# Patient Record
Sex: Female | Born: 1960 | Hispanic: Yes | Marital: Married | Smoking: Never smoker
Health system: Southern US, Community
[De-identification: ages and names within clinical notes are randomized; demographics above are authoritative.]

## PROBLEM LIST (undated history)

## (undated) DIAGNOSIS — I10 Essential (primary) hypertension: Secondary | ICD-10-CM

## (undated) DIAGNOSIS — E119 Type 2 diabetes mellitus without complications: Secondary | ICD-10-CM

---

## 2004-12-10 ENCOUNTER — Ambulatory Visit: Payer: Self-pay | Admitting: General Practice

## 2006-01-12 ENCOUNTER — Ambulatory Visit: Payer: Self-pay | Admitting: General Practice

## 2006-07-22 DIAGNOSIS — I10 Essential (primary) hypertension: Secondary | ICD-10-CM | POA: Insufficient documentation

## 2006-07-22 DIAGNOSIS — F064 Anxiety disorder due to known physiological condition: Secondary | ICD-10-CM | POA: Insufficient documentation

## 2006-07-22 DIAGNOSIS — E782 Mixed hyperlipidemia: Secondary | ICD-10-CM | POA: Insufficient documentation

## 2006-07-22 DIAGNOSIS — R002 Palpitations: Secondary | ICD-10-CM | POA: Insufficient documentation

## 2007-05-19 ENCOUNTER — Ambulatory Visit: Payer: Self-pay | Admitting: Internal Medicine

## 2008-09-25 ENCOUNTER — Ambulatory Visit: Payer: Self-pay | Admitting: Internal Medicine

## 2008-10-01 ENCOUNTER — Ambulatory Visit: Payer: Self-pay | Admitting: Family Medicine

## 2008-11-30 ENCOUNTER — Ambulatory Visit (HOSPITAL_COMMUNITY): Admission: RE | Admit: 2008-11-30 | Discharge: 2008-11-30 | Payer: Self-pay | Admitting: Neurosurgery

## 2008-12-26 ENCOUNTER — Encounter: Payer: Self-pay | Admitting: Neurosurgery

## 2009-01-17 ENCOUNTER — Encounter: Payer: Self-pay | Admitting: Neurosurgery

## 2009-05-28 ENCOUNTER — Ambulatory Visit: Payer: Self-pay | Admitting: Internal Medicine

## 2010-10-28 LAB — CBC
HCT: 41.8 % (ref 36.0–46.0)
Hemoglobin: 14.4 g/dL (ref 12.0–15.0)
MCHC: 34.5 g/dL (ref 30.0–36.0)
RDW: 13.9 % (ref 11.5–15.5)

## 2010-10-28 LAB — BASIC METABOLIC PANEL
CO2: 28 mEq/L (ref 19–32)
Calcium: 9.8 mg/dL (ref 8.4–10.5)
Glucose, Bld: 94 mg/dL (ref 70–99)
Potassium: 4 mEq/L (ref 3.5–5.1)
Sodium: 142 mEq/L (ref 135–145)

## 2010-12-02 NOTE — Op Note (Signed)
Diana Bonilla, Diana Bonilla              ACCOUNT NO.:  [id number]   MEDICAL RECORD NO.:  [id number]          PATIENT TYPE:  OIB   LOCATION:  3534                         FACILITY:  [facility]   PHYSICIAN:  Reinaldo Meeker, M.D. DATE OF BIRTH:  Sep 19, 1960   DATE OF PROCEDURE:  11/30/2008  DATE OF DISCHARGE:  11/30/2008                               OPERATIVE REPORT   PREOPERATIVE DIAGNOSIS:  Herniated disk L4-5, right.   POSTOPERATIVE DIAGNOSIS:  Herniated disk L4-5, right.   PROCEDURE:  Right L4-5 interlaminar laminotomy for excision of herniated  disk with operative microscope.   SECONDARY PROCEDURE:  Microdissection L4-5 disk and L5 nerve root.   SURGEON:  Reinaldo Meeker, MD   ASSISTANT:  Donalee Citrin, MD   PROCEDURE IN DETAIL:  After being placed in the prone position, the  patient's back was prepped and draped in usual sterile fashion.  Localizing x-rays taken prior to incision to identify the appropriate  level.  Midline incision was made above the spinous processes of L4 and  L5.  Using Bovie cutting current, the incision was carried down the  spinous processes.  A subperiosteal dissection was carried out on the  right side of the spinous processes and lamina.  Self-retaining  retractor was placed for exposure.  X-rays showed approach to the  appropriate level.  Using a high-speed drill, the inferior one-third of  the L4 lamina, the medial one-third of the facet joint, and superior one-  third of the L5 lamina were removed.  Residual bone was removed with the  Kerrison punch.  Ligamentum flavum was removed in a piecemeal fashion.  Microscope was draped, brought into the field, and used for remainder of  the case.  Using microdissection technique, the lateral aspect of thecal  sac and L5 nerve root were identified.  Further coagulation was carried  out down from the canal to identify the L4-5 disk, which was found to be  remarkably herniated beneath the nerve root.  After coagulating  on the  annulus, the annulus was incised with a 15 blade.  Using pituitary  rongeurs and curettes, the disk space clean-out was carried out.  At the  same time, great care was taken to avoid injury to the neural elements,  this was successfully done.  At this point, inspection was carried out  in all directions for any evidence of residual compression and none  could be identified.  Large amounts of irrigation were carried out.  Any  bleeding controlled with bipolar coagulation and Gelfoam.  The wounds  were then closed in multiple layers of Vicryl on the muscle, fascia,  subcutaneous, subcuticular tissues and staples on the skin.  A sterile  dressing was then applied, and the patient was extubated and taken to  recovery room in stable condition.           ______________________________  Reinaldo Meeker, M.D.     ROK/MEDQ  D:  11/30/2008  T:  12/01/2008  Job:  [id number]

## 2013-09-18 ENCOUNTER — Ambulatory Visit: Payer: Self-pay | Admitting: Internal Medicine

## 2013-11-05 ENCOUNTER — Emergency Department: Payer: Self-pay | Admitting: Emergency Medicine

## 2014-09-21 ENCOUNTER — Ambulatory Visit: Payer: Self-pay | Admitting: Internal Medicine

## 2015-05-27 ENCOUNTER — Other Ambulatory Visit: Payer: Self-pay | Admitting: Internal Medicine

## 2015-05-27 DIAGNOSIS — Z1231 Encounter for screening mammogram for malignant neoplasm of breast: Secondary | ICD-10-CM

## 2015-09-24 ENCOUNTER — Ambulatory Visit
Admission: RE | Admit: 2015-09-24 | Discharge: 2015-09-24 | Disposition: A | Discharge status: Home / Self Care | Payer: BLUE CROSS/BLUE SHIELD | Source: Ambulatory Visit | Attending: Internal Medicine | Admitting: Internal Medicine

## 2015-09-24 DIAGNOSIS — Z1231 Encounter for screening mammogram for malignant neoplasm of breast: Secondary | ICD-10-CM | POA: Diagnosis not present

## 2016-09-22 ENCOUNTER — Other Ambulatory Visit: Payer: Self-pay | Admitting: Internal Medicine

## 2016-09-22 DIAGNOSIS — Z1231 Encounter for screening mammogram for malignant neoplasm of breast: Secondary | ICD-10-CM

## 2016-10-29 ENCOUNTER — Ambulatory Visit
Admission: RE | Admit: 2016-10-29 | Discharge: 2016-10-29 | Disposition: A | Discharge status: Home / Self Care | Payer: BLUE CROSS/BLUE SHIELD | Source: Ambulatory Visit | Attending: Internal Medicine | Admitting: Internal Medicine

## 2016-10-29 DIAGNOSIS — Z1231 Encounter for screening mammogram for malignant neoplasm of breast: Secondary | ICD-10-CM | POA: Diagnosis not present

## 2017-10-07 ENCOUNTER — Other Ambulatory Visit: Payer: Self-pay | Admitting: Internal Medicine

## 2017-10-07 DIAGNOSIS — Z1231 Encounter for screening mammogram for malignant neoplasm of breast: Secondary | ICD-10-CM

## 2017-10-26 ENCOUNTER — Ambulatory Visit
Admission: RE | Admit: 2017-10-26 | Discharge: 2017-10-26 | Disposition: A | Discharge status: Home / Self Care | Payer: BLUE CROSS/BLUE SHIELD | Source: Ambulatory Visit | Attending: Internal Medicine | Admitting: Internal Medicine

## 2017-10-26 DIAGNOSIS — Z1231 Encounter for screening mammogram for malignant neoplasm of breast: Secondary | ICD-10-CM | POA: Diagnosis present

## 2018-09-15 ENCOUNTER — Other Ambulatory Visit: Payer: Self-pay | Admitting: Family

## 2018-09-15 DIAGNOSIS — Z1231 Encounter for screening mammogram for malignant neoplasm of breast: Secondary | ICD-10-CM

## 2019-01-11 ENCOUNTER — Ambulatory Visit
Admission: RE | Admit: 2019-01-11 | Discharge: 2019-01-11 | Disposition: A | Discharge status: Home / Self Care | Payer: BC Managed Care – PPO | Source: Ambulatory Visit | Attending: Family | Admitting: Family

## 2019-01-11 ENCOUNTER — Other Ambulatory Visit: Payer: Self-pay

## 2019-01-11 DIAGNOSIS — Z1231 Encounter for screening mammogram for malignant neoplasm of breast: Secondary | ICD-10-CM | POA: Diagnosis not present

## 2019-03-28 ENCOUNTER — Other Ambulatory Visit: Payer: Self-pay

## 2019-03-28 DIAGNOSIS — Z20822 Contact with and (suspected) exposure to covid-19: Secondary | ICD-10-CM

## 2019-03-29 LAB — NOVEL CORONAVIRUS, NAA: SARS-CoV-2, NAA: NOT DETECTED

## 2019-11-04 ENCOUNTER — Other Ambulatory Visit: Payer: Self-pay

## 2019-11-04 ENCOUNTER — Encounter: Payer: Self-pay | Admitting: Emergency Medicine

## 2019-11-04 ENCOUNTER — Ambulatory Visit
Admission: EM | Admit: 2019-11-04 | Discharge: 2019-11-04 | Disposition: A | Discharge status: Home / Self Care | Payer: BC Managed Care – PPO | Attending: Family Medicine | Admitting: Family Medicine

## 2019-11-04 DIAGNOSIS — R200 Anesthesia of skin: Secondary | ICD-10-CM | POA: Diagnosis not present

## 2019-11-04 HISTORY — DX: Type 2 diabetes mellitus without complications: E11.9

## 2019-11-04 HISTORY — DX: Essential (primary) hypertension: I10

## 2019-11-04 MED ORDER — MELOXICAM 15 MG PO TABS: 15.0000 mg | ORAL_TABLET | Freq: Every day | ORAL | 0 refills | Status: AC | PRN

## 2019-11-04 NOTE — Discharge Instructions (Signed)
Medication daily as needed.  Splint at night.  If persists, see [facility] [phone]) OR [facility] [phone]).  Take care  Dr. Adriana Simas

## 2019-11-04 NOTE — ED Provider Notes (Signed)
[facility]    CSN: [id number] Arrival date & time: 11/04/19  1430      History   Chief Complaint Chief Complaint  Patient presents with  . Numbness    right  . Blister    right thumb   HPI  59 year old female presents with the above complaints.  Patient reports ongoing numbness of her right hand.  Patient states that this all started on the Saturday after she received her Covid vaccine on 4/7.  Patient reports right hand numbness.  Patient also reports some raised areas on her right thumb.  She is unsure of the etiology of these.  Patient reports that her pain occurs primarily at night.  4/10 in severity.  She is bothered by the hand numbness.  She is concerned that she has poor circulation.  No relieving factors.  No other associated symptoms.  No other complaints.  Past Medical History:  Diagnosis Date  . Diabetes mellitus without complication ([place])   . Hypertension    Home Medications    Prior to Admission medications   Medication Sig Start Date End Date Taking? Authorizing Provider  losartan-hydrochlorothiazide (HYZAAR) 100-12.5 MG tablet Take by mouth. 10/04/13  Yes [provider]  metFORMIN (GLUCOPHAGE-XR) 500 MG 24 hr tablet Take 500 mg by mouth daily. 10/09/19  Yes [provider]  simvastatin (ZOCOR) 20 MG tablet Take 20 mg by mouth at bedtime. 10/09/19  Yes [provider]  verapamil (CALAN-SR) 240 MG CR tablet Take 240 mg by mouth daily. 10/09/19  Yes [provider]  meloxicam (MOBIC) 15 MG tablet Take 1 tablet (15 mg total) by mouth daily as needed. 11/04/19   Coral Spikes, DO    Family History Family History  Problem Relation Age of Onset  . Breast cancer Neg Hx     Social History Social History   Tobacco Use  . Smoking status: Never Smoker  . Smokeless tobacco: Never Used  Substance Use Topics  . Alcohol use: Not Currently  . Drug use: Never     Allergies   Patient has no known allergies.    Review of Systems Review of Systems Per HPI  Physical Exam Triage Vital Signs ED Triage Vitals  Enc Vitals Group     BP 11/04/19 1445 (!) 149/72     Pulse Rate 11/04/19 1445 66     Resp 11/04/19 1445 14     Temp 11/04/19 1445 98.1 F (36.7 C)     Temp Source 11/04/19 1445 Oral     SpO2 11/04/19 1445 100 %     Weight 11/04/19 1441 175 lb (79.4 kg)     Height 11/04/19 1441 5\' 4"  (1.626 m)     Head Circumference --      Peak Flow --      Pain Score 11/04/19 1441 4     Pain Loc --      Pain Edu? --      Excl. in [place]? --    Updated Vital Signs BP (!) 149/72 (BP Location: Left Arm)   Pulse 66   Temp 98.1 F (36.7 C) (Oral)   Resp 14   Ht 5\' 4"  (1.626 m)   Wt 79.4 kg   SpO2 100%   BMI 30.04 kg/m   Visual Acuity Right Eye Distance:   Left Eye Distance:   Bilateral Distance:    Right Eye Near:   Left Eye Near:    Bilateral Near:     Physical Exam  Vitals and nursing note reviewed.  Constitutional:      General: She is not in acute distress.    Appearance: Normal appearance. She is not ill-appearing.  HENT:     Head: Normocephalic and atraumatic.  Eyes:     General:        Right eye: No discharge.        Left eye: No discharge.     Conjunctiva/sclera: Conjunctivae normal.  Cardiovascular:     Rate and Rhythm: Normal rate and regular rhythm.     Heart sounds: No murmur.  Pulmonary:     Effort: Pulmonary effort is normal. No respiratory distress.  Skin:    Comments: Raised skin lesions noted on the right thumb.  Neurological:     Mental Status: She is alert.  Psychiatric:        Mood and Affect: Mood normal.        Behavior: Behavior normal.    UC Treatments / Results  Labs (all labs ordered are listed, but only abnormal results are displayed) Labs Reviewed - No data to display  EKG   Radiology No results found.  Procedures Procedures (including critical care time)  Medications Ordered in UC Medications - No data to display  Initial  Impression / Assessment and Plan / UC Course  I have reviewed the triage vital signs and the nursing notes.  Pertinent labs & imaging results that were available during my care of the patient were reviewed by me and considered in my medical decision making (see chart for details).    59 year old female presents with numbness of the right hand. Patient also has some skin lesions which are of uncertain etiology. I suspect that the numbness in the right hand is secondary to carpal tunnel. However, possibly related to vaccine. Placing on meloxicam. Placing in in a wrist plan at night. Supportive care.  Final Clinical Impressions(s) / UC Diagnoses   Final diagnoses:  Numbness of right hand     Discharge Instructions     Medication daily as needed.  Splint at night.  If persists, see [facility] [phone]) OR [facility] [phone]).  Take care  Dr. Adriana Simas     ED Prescriptions    Medication Sig Dispense Auth. Provider   meloxicam (MOBIC) 15 MG tablet Take 1 tablet (15 mg total) by mouth daily as needed. 30 tablet Tommie Sams, DO     PDMP not reviewed this encounter.   Tommie Sams, [place] 11/04/19 1556

## 2019-11-04 NOTE — ED Triage Notes (Signed)
Patient states that she got her 1st COVID Moderna vaccine on 10/25/19.  Patient states that she started to get soreness in her right arm when she got her COVID shot.  Patient states that it started to spread down her right arm and has numbness in her right hand.  Patient also reports blisters on her right thumb that started couple of days ago.

## 2020-01-08 ENCOUNTER — Other Ambulatory Visit: Payer: Self-pay | Admitting: Internal Medicine

## 2020-01-08 DIAGNOSIS — Z1231 Encounter for screening mammogram for malignant neoplasm of breast: Secondary | ICD-10-CM

## 2020-01-17 ENCOUNTER — Ambulatory Visit: Payer: BC Managed Care – PPO

## 2020-01-18 ENCOUNTER — Ambulatory Visit
Admission: RE | Admit: 2020-01-18 | Discharge: 2020-01-18 | Disposition: A | Discharge status: Home / Self Care | Payer: BC Managed Care – PPO | Source: Ambulatory Visit | Attending: Internal Medicine | Admitting: Internal Medicine

## 2020-01-18 DIAGNOSIS — Z1231 Encounter for screening mammogram for malignant neoplasm of breast: Secondary | ICD-10-CM | POA: Insufficient documentation

## 2020-11-25 ENCOUNTER — Other Ambulatory Visit: Payer: Self-pay | Admitting: Internal Medicine

## 2020-11-25 DIAGNOSIS — Z1231 Encounter for screening mammogram for malignant neoplasm of breast: Secondary | ICD-10-CM

## 2021-01-22 ENCOUNTER — Other Ambulatory Visit: Payer: Self-pay

## 2021-01-22 ENCOUNTER — Ambulatory Visit
Admission: RE | Admit: 2021-01-22 | Discharge: 2021-01-22 | Disposition: A | Discharge status: Home / Self Care | Payer: BC Managed Care – PPO | Source: Ambulatory Visit | Attending: Internal Medicine | Admitting: Internal Medicine

## 2021-01-22 DIAGNOSIS — Z1231 Encounter for screening mammogram for malignant neoplasm of breast: Secondary | ICD-10-CM | POA: Insufficient documentation

## 2021-01-28 ENCOUNTER — Other Ambulatory Visit: Payer: Self-pay | Admitting: Internal Medicine

## 2021-01-28 DIAGNOSIS — N6489 Other specified disorders of breast: Secondary | ICD-10-CM

## 2021-01-28 DIAGNOSIS — R928 Other abnormal and inconclusive findings on diagnostic imaging of breast: Secondary | ICD-10-CM

## 2021-01-31 ENCOUNTER — Ambulatory Visit
Admission: RE | Admit: 2021-01-31 | Discharge: 2021-01-31 | Disposition: A | Discharge status: Home / Self Care | Payer: BC Managed Care – PPO | Source: Ambulatory Visit | Attending: Internal Medicine | Admitting: Internal Medicine

## 2021-01-31 ENCOUNTER — Other Ambulatory Visit: Payer: Self-pay

## 2021-01-31 DIAGNOSIS — R928 Other abnormal and inconclusive findings on diagnostic imaging of breast: Secondary | ICD-10-CM

## 2021-01-31 DIAGNOSIS — N6489 Other specified disorders of breast: Secondary | ICD-10-CM | POA: Insufficient documentation

## 2021-08-02 IMAGING — MG DIGITAL SCREENING BILAT W/ TOMO W/ CAD
8 series · 9 of 24 positions shown · non-contrast
Comparison: Previous exam(s).

CLINICAL DATA: Screening.

EXAM:
DIGITAL SCREENING BILATERAL MAMMOGRAM WITH TOMO AND CAD

[L MLO synth-2D]
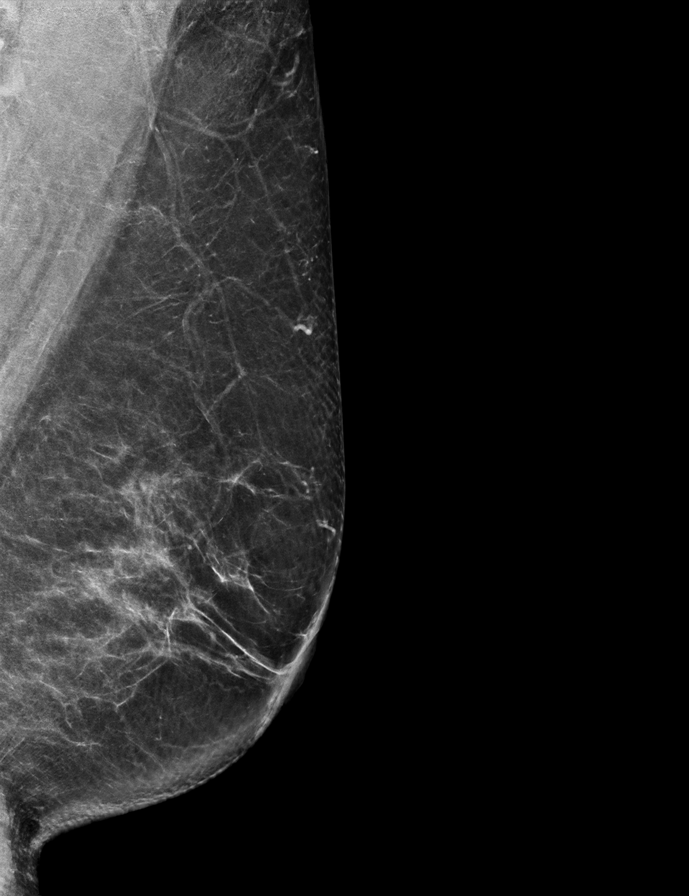

[R MLO synth-2D]
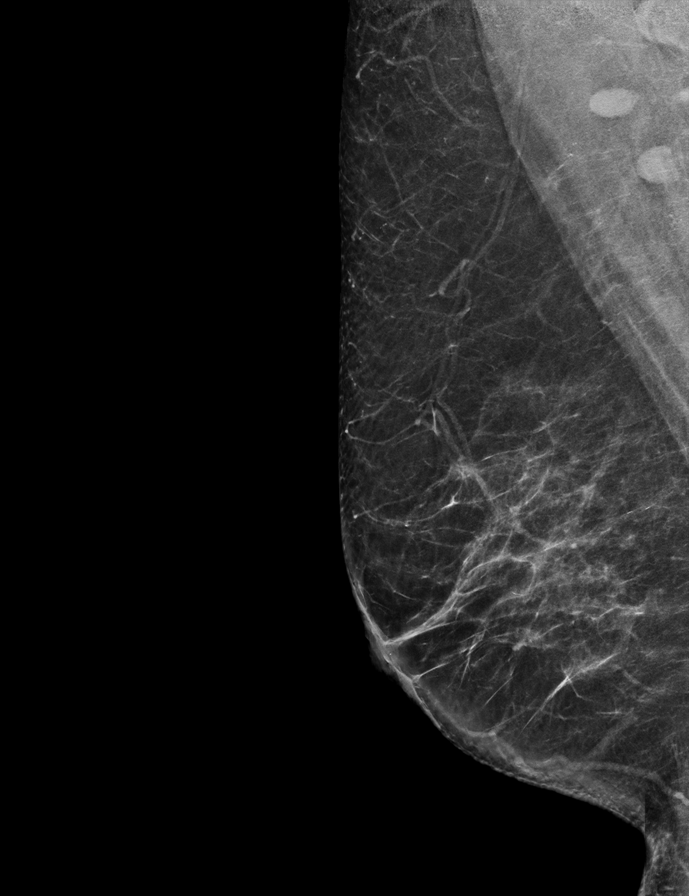

[R CC synth-2D]
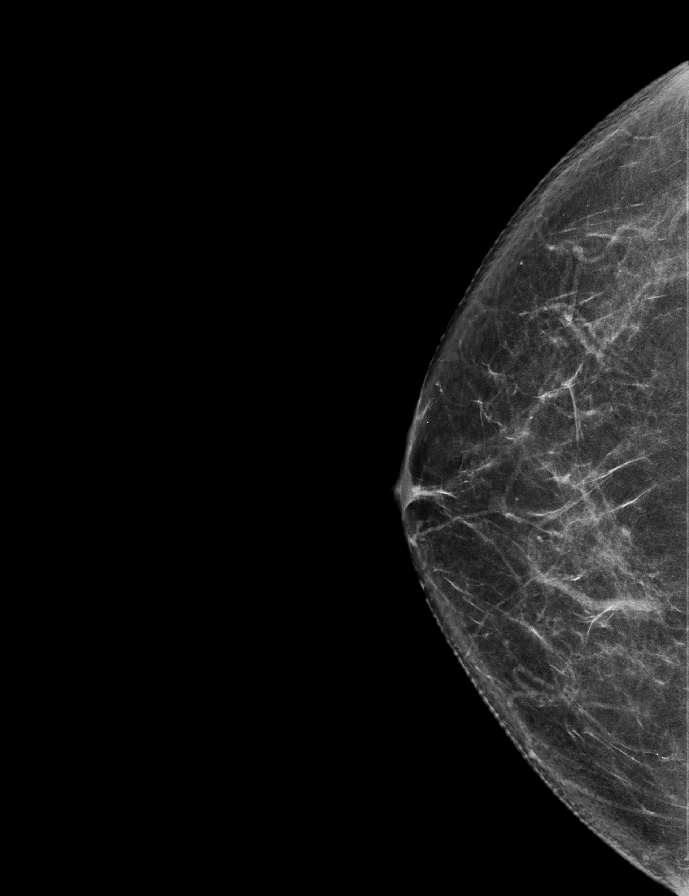

[L CC synth-2D]
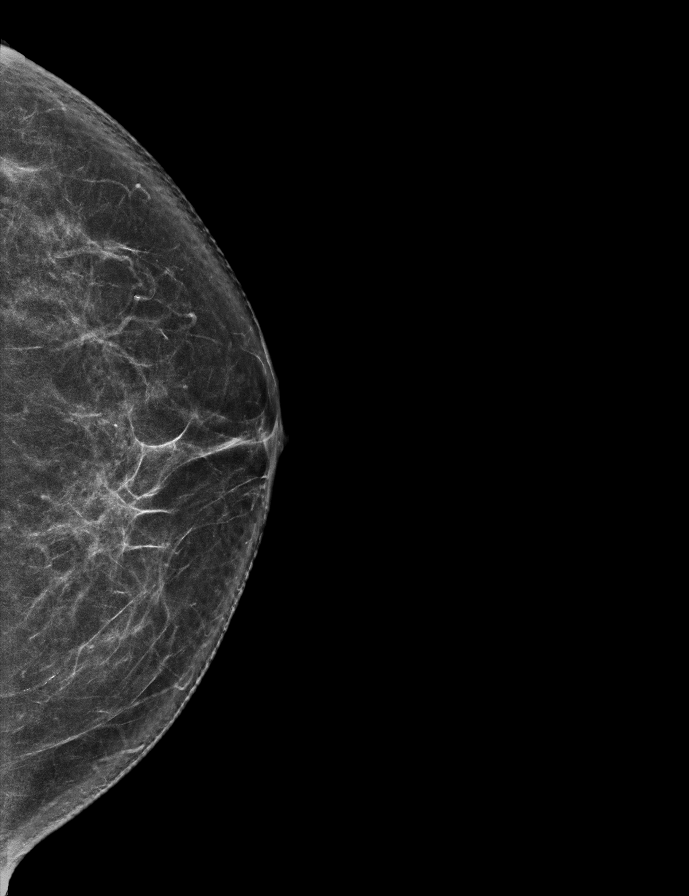

[R CC tomo · 2 of 69 frames shown]
[frame 23/69]
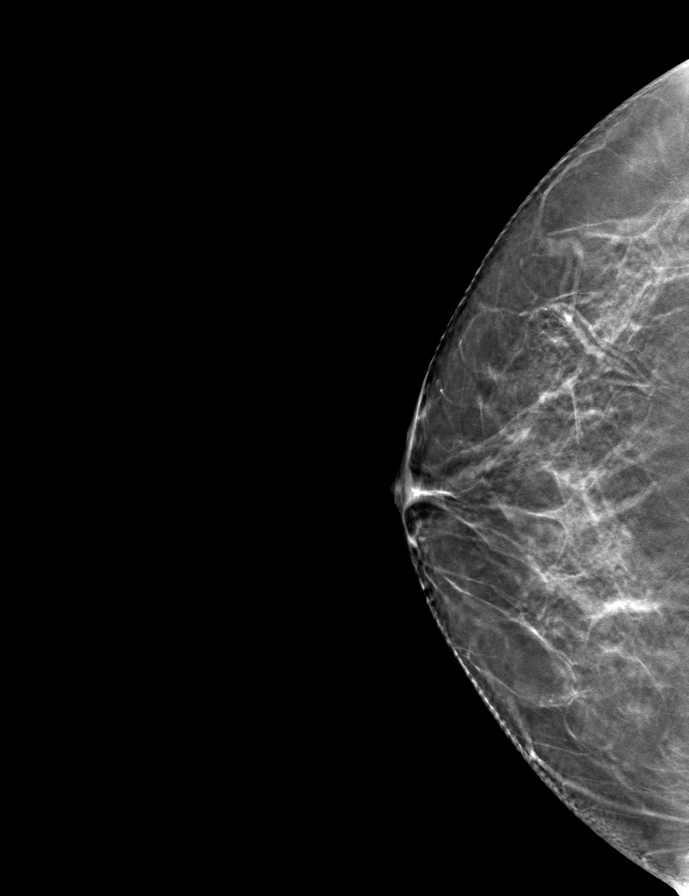
[frame 35/69]
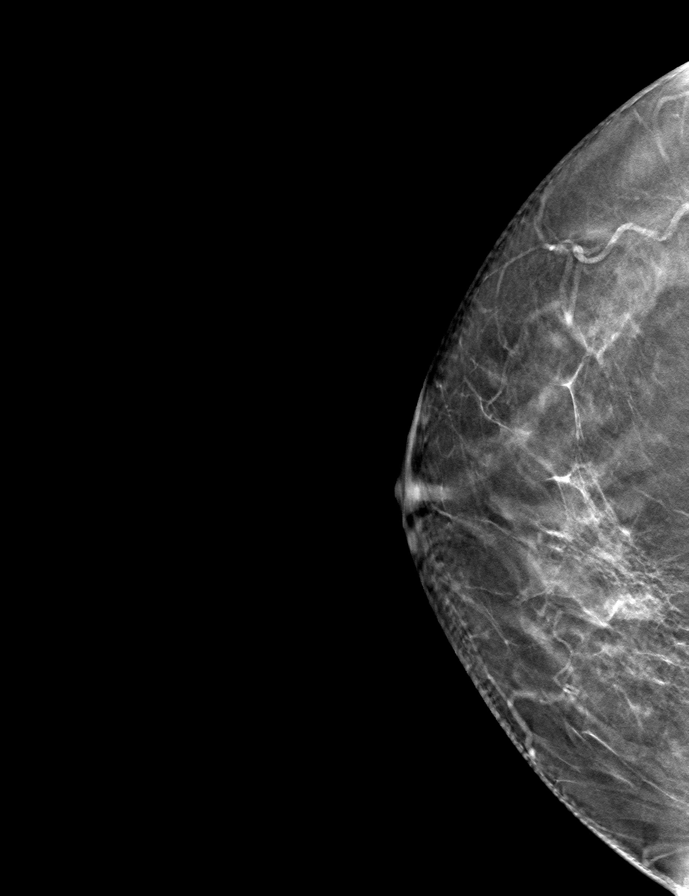

[R MLO tomo · tomo slice 37/74.0]
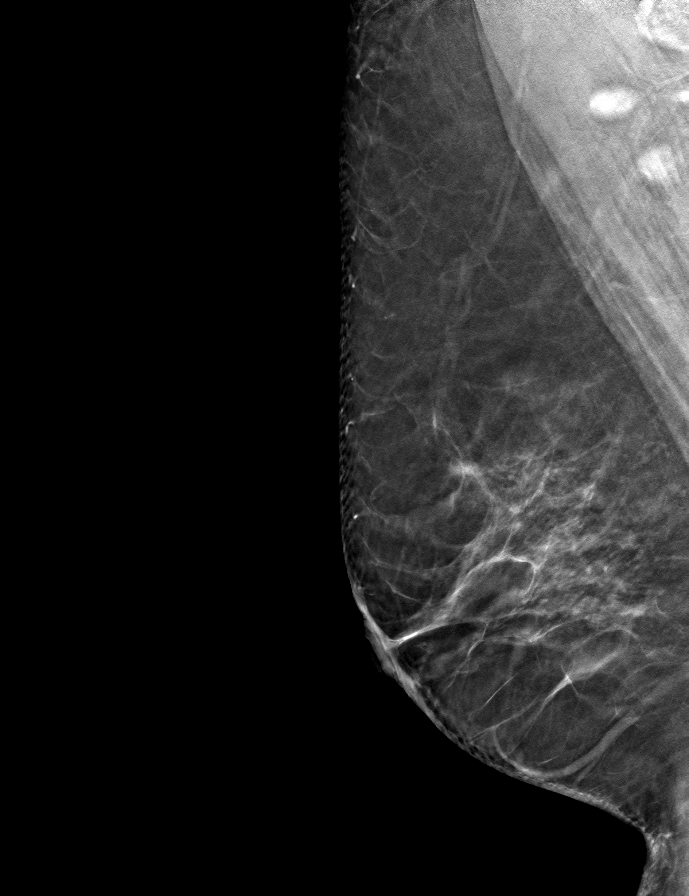

[L CC tomo · tomo slice 35/70.0]
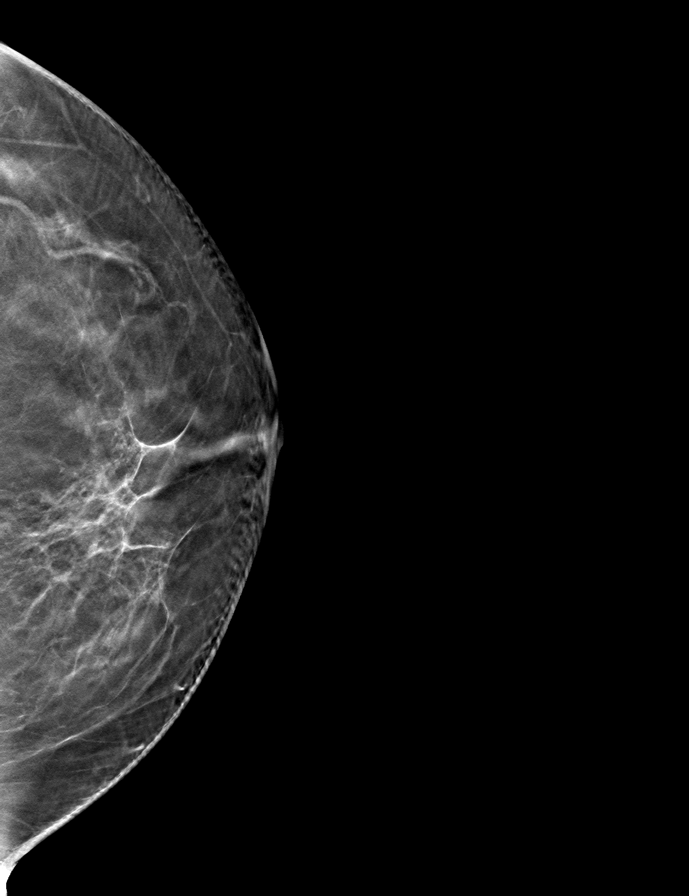

[L MLO tomo · tomo slice 39/77.0]
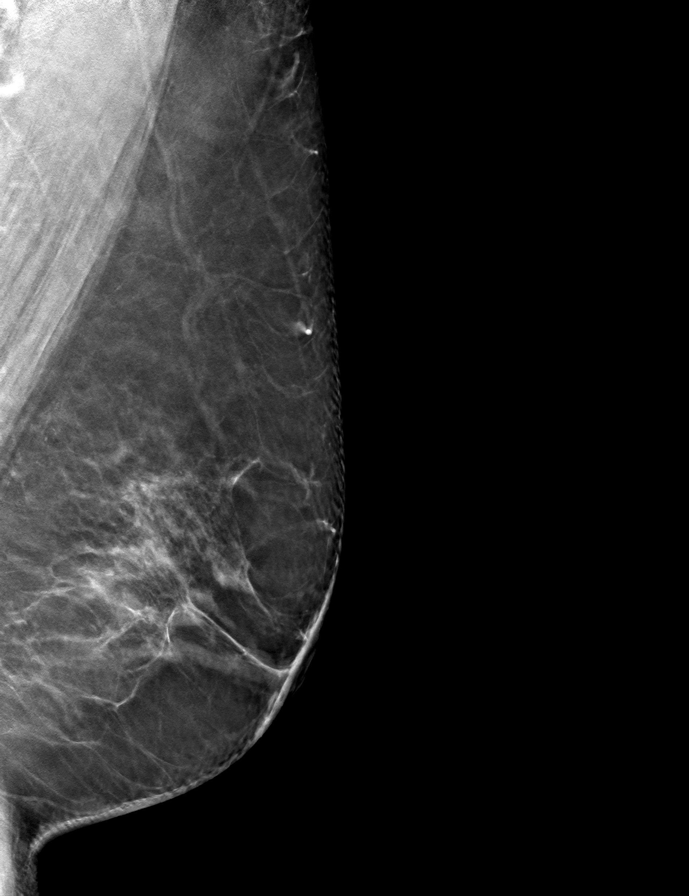

[9 of 24 positions shown; findings below may reference images not displayed]

ACR Breast Density Category b: There are scattered areas of
fibroglandular density.
FINDINGS: There are no findings suspicious for malignancy. Images were
processed with CAD.
IMPRESSION: No mammographic evidence of malignancy. A result letter of this
screening mammogram will be mailed directly to the patient.

RECOMMENDATION:
Screening mammogram in one year. (Code:CN-U-775)

BI-RADS CATEGORY  1: Negative.

## 2021-09-15 DIAGNOSIS — M1711 Unilateral primary osteoarthritis, right knee: Secondary | ICD-10-CM | POA: Insufficient documentation

## 2021-12-12 ENCOUNTER — Ambulatory Visit
Admission: RE | Admit: 2021-12-12 | Discharge: 2021-12-12 | Disposition: A | Discharge status: Home / Self Care | Payer: BC Managed Care – PPO | Source: Ambulatory Visit | Attending: Family | Admitting: Family

## 2021-12-12 ENCOUNTER — Other Ambulatory Visit: Payer: Self-pay | Admitting: Family

## 2021-12-12 DIAGNOSIS — R600 Localized edema: Secondary | ICD-10-CM | POA: Diagnosis present

## 2022-01-12 ENCOUNTER — Other Ambulatory Visit: Payer: Self-pay | Admitting: Internal Medicine

## 2022-01-12 DIAGNOSIS — Z1231 Encounter for screening mammogram for malignant neoplasm of breast: Secondary | ICD-10-CM

## 2022-02-16 ENCOUNTER — Ambulatory Visit
Admission: RE | Admit: 2022-02-16 | Discharge: 2022-02-16 | Disposition: A | Discharge status: Home / Self Care | Payer: BC Managed Care – PPO | Source: Ambulatory Visit | Attending: Internal Medicine | Admitting: Internal Medicine

## 2022-02-16 DIAGNOSIS — Z1231 Encounter for screening mammogram for malignant neoplasm of breast: Secondary | ICD-10-CM | POA: Insufficient documentation

## 2022-10-10 ENCOUNTER — Other Ambulatory Visit: Payer: Self-pay | Admitting: Internal Medicine

## 2022-10-10 DIAGNOSIS — I10 Essential (primary) hypertension: Secondary | ICD-10-CM

## 2022-10-15 ENCOUNTER — Encounter: Payer: Self-pay | Admitting: Internal Medicine

## 2022-10-15 ENCOUNTER — Ambulatory Visit: Payer: BC Managed Care – PPO | Admitting: Internal Medicine

## 2022-10-15 VITALS — BP 130/64 | HR 74 | Ht 65.0 in | Wt 182.0 lb

## 2022-10-15 DIAGNOSIS — E1165 Type 2 diabetes mellitus with hyperglycemia: Secondary | ICD-10-CM | POA: Diagnosis not present

## 2022-10-15 DIAGNOSIS — M62838 Other muscle spasm: Secondary | ICD-10-CM

## 2022-10-15 DIAGNOSIS — I10 Essential (primary) hypertension: Secondary | ICD-10-CM | POA: Diagnosis not present

## 2022-10-15 DIAGNOSIS — E782 Mixed hyperlipidemia: Secondary | ICD-10-CM

## 2022-10-15 DIAGNOSIS — E559 Vitamin D deficiency, unspecified: Secondary | ICD-10-CM

## 2022-10-15 LAB — POCT CBG (FASTING - GLUCOSE)-MANUAL ENTRY: Glucose Fasting, POC: 91 mg/dL (ref 70–99)

## 2022-10-15 MED ORDER — BACLOFEN 10 MG PO TABS: 10.0000 mg | ORAL_TABLET | Freq: Every day | ORAL | 1 refills | Status: AC

## 2022-10-15 MED ORDER — MELOXICAM 15 MG PO TABS: 15.0000 mg | ORAL_TABLET | Freq: Every day | ORAL | 6 refills | Status: DC

## 2022-10-15 NOTE — Progress Notes (Signed)
Established Patient Office Visit  Subjective:  Patient ID: Diana Bonilla, female    DOB: April 14, 1961  Age: 62 y.o. MRN: [id number]  Chief Complaint  Patient presents with   Follow-up    3 month follow up    Patient comes in for follow-up today.  She is generally feeling quite well.  She has an appointment with dermatologist next month for hair loss.  She has joined a Zumba class but complains of upper shoulder and upper back muscle spasms and pain.  Will send in a refill for her meloxicam and baclofen to be taken at night.  Patient will return fasting for blood work.    No other concerns at this time.   Past Medical History:  Diagnosis Date   Diabetes mellitus without complication ([place])    Hypertension     History reviewed. No pertinent surgical history.  Social History   Socioeconomic History   Marital status: Married    Spouse name: Not on file   Number of children: Not on file   Years of education: Not on file   Highest education level: Not on file  Occupational History   Not on file  Tobacco Use   Smoking status: Never   Smokeless tobacco: Never  Vaping Use   Vaping Use: Never used  Substance and Sexual Activity   Alcohol use: Not Currently   Drug use: Never   Sexual activity: Not on file  Other Topics Concern   Not on file  Social History Narrative   Not on file   Social Determinants of Health   Financial Resource Strain: Not on file  Food Insecurity: Not on file  Transportation Needs: Not on file  Physical Activity: Not on file  Stress: Not on file  Social Connections: Not on file  Intimate Partner Violence: Not on file    Family History  Problem Relation Age of Onset   Osteoporosis Mother    Hypertension Father    Breast cancer Neg Hx     No Known Allergies  Review of Systems  Constitutional:  Negative for chills, diaphoresis, malaise/fatigue and weight loss.  HENT:  Negative for congestion, ear discharge, ear pain, hearing loss,  nosebleeds, sinus pain and tinnitus.   Eyes:  Negative for blurred vision, double vision, photophobia, pain and redness.  Respiratory:  Negative for cough and sputum production.   Cardiovascular:  Negative for chest pain, palpitations, leg swelling and PND.  Gastrointestinal: Negative.   Genitourinary: Negative.   Musculoskeletal:  Positive for back pain and myalgias. Negative for falls, joint pain and neck pain.  Neurological: Negative.   Psychiatric/Behavioral: Negative.         Objective:   BP 130/64   Pulse 74   Ht 5\' 5"  (1.651 m)   Wt 182 lb (82.6 kg)   SpO2 97%   BMI 30.29 kg/m   Vitals:   10/15/22 1444  BP: 130/64  Pulse: 74  Height: 5\' 5"  (1.651 m)  Weight: 182 lb (82.6 kg)  SpO2: 97%  BMI (Calculated): 30.29    Physical Exam Vitals and nursing note reviewed.  Constitutional:      Appearance: Normal appearance.  HENT:     Head: Normocephalic.  Cardiovascular:     Rate and Rhythm: Normal rate and regular rhythm.     Pulses: Normal pulses.     Heart sounds: Normal heart sounds.  Pulmonary:     Effort: Pulmonary effort is normal.  Abdominal:     General: Abdomen is  flat.     Palpations: Abdomen is soft.  Musculoskeletal:        General: Tenderness (upper back.) present.     Cervical back: Normal range of motion and neck supple.  Skin:    General: Skin is warm.  Neurological:     General: No focal deficit present.     Mental Status: She is alert and oriented to person, place, and time.  Psychiatric:        Mood and Affect: Mood normal.        Behavior: Behavior normal.      Results for orders placed or performed in visit on 10/15/22  POCT CBG (Fasting - Glucose)  Result Value Ref Range   Glucose Fasting, POC 91 70 - 99 mg/dL    Recent Results (from the past 2160 hour(s))  POCT CBG (Fasting - Glucose)     Status: Normal   Collection Time: 10/15/22  2:46 PM  Result Value Ref Range   Glucose Fasting, POC 91 70 - 99 mg/dL      Assessment &  Plan:  Patient advised to continue taking all her medications.  She will take meloxicam and baclofen at bedtime for upper back pain if needed. Problem List Items Addressed This Visit     Mixed hyperlipidemia   Relevant Orders   Lipid Panel w/o Chol/HDL Ratio   Other Visit Diagnoses     Type 2 diabetes mellitus with hyperglycemia, without long-term current use of insulin (HCC)    -  Primary   Relevant Orders   POCT CBG (Fasting - Glucose) (Completed)   Hemoglobin A1c   Muscle spasm of left shoulder area       Relevant Medications   meloxicam (MOBIC) 15 MG tablet   baclofen (LIORESAL) 10 MG tablet   Vitamin D deficiency       Relevant Orders   Vitamin D (25 hydroxy)   Essential hypertension, benign       Relevant Orders   CMP14+EGFR       Return in about 3 months (around 01/15/2023).   Total time spent: 30 minutes  Perrin Maltese, MD  10/15/2022

## 2022-10-22 ENCOUNTER — Other Ambulatory Visit: Payer: BC Managed Care – PPO

## 2022-10-23 LAB — LIPID PANEL W/O CHOL/HDL RATIO
Cholesterol, Total: 133 mg/dL (ref 100–199)
HDL: 56 mg/dL (ref >39)
LDL Chol Calc (NIH): 61 mg/dL (ref 0–99)
Triglycerides: 80 mg/dL (ref 0–149)
VLDL Cholesterol Cal: 16 mg/dL (ref 5–40)

## 2022-10-23 LAB — CMP14+EGFR
ALT: 30 IU/L (ref 0–32)
AST: 26 IU/L (ref 0–40)
Albumin/Globulin Ratio: 1.6 (ref 1.2–2.2)
Albumin: 4.3 g/dL (ref 3.8–4.9)
Alkaline Phosphatase: 92 IU/L (ref 44–121)
BUN/Creatinine Ratio: 18 (ref 9–23)
BUN: 10 mg/dL (ref 6–24)
Bilirubin Total: 0.2 mg/dL (ref 0.0–1.2)
CO2: 22 mmol/L (ref 20–29)
Calcium: 9.1 mg/dL (ref 8.7–10.2)
Chloride: 106 mmol/L (ref 96–106)
Creatinine, Ser: 0.56 mg/dL — ABNORMAL LOW (ref 0.57–1.00)
Globulin, Total: 2.7 g/dL (ref 1.5–4.5)
Glucose: 94 mg/dL (ref 70–99)
Potassium: 4.4 mmol/L (ref 3.5–5.2)
Sodium: 144 mmol/L (ref 134–144)
Total Protein: 7 g/dL (ref 6.0–8.5)
eGFR: 105 mL/min/{1.73_m2} (ref >59)

## 2022-10-23 LAB — HEMOGLOBIN A1C
Est. average glucose Bld gHb Est-mCnc: 128 mg/dL
Hgb A1c MFr Bld: 6.1 % — ABNORMAL HIGH (ref 4.8–5.6)

## 2022-10-23 LAB — VITAMIN D 25 HYDROXY (VIT D DEFICIENCY, FRACTURES): Vit D, 25-Hydroxy: 74.4 ng/mL (ref 30.0–100.0)

## 2022-11-01 ENCOUNTER — Encounter: Payer: Self-pay | Admitting: Family

## 2022-12-10 ENCOUNTER — Other Ambulatory Visit: Payer: Self-pay | Admitting: Internal Medicine

## 2022-12-10 DIAGNOSIS — E782 Mixed hyperlipidemia: Secondary | ICD-10-CM

## 2023-01-11 ENCOUNTER — Other Ambulatory Visit: Payer: Self-pay | Admitting: Internal Medicine

## 2023-01-11 DIAGNOSIS — E1165 Type 2 diabetes mellitus with hyperglycemia: Secondary | ICD-10-CM

## 2023-01-15 ENCOUNTER — Encounter: Payer: Self-pay | Admitting: Internal Medicine

## 2023-01-15 ENCOUNTER — Ambulatory Visit (INDEPENDENT_AMBULATORY_CARE_PROVIDER_SITE_OTHER): Payer: BC Managed Care – PPO | Admitting: Internal Medicine

## 2023-01-15 VITALS — BP 119/71 | HR 84 | Ht 61.0 in | Wt 182.0 lb

## 2023-01-15 VITALS — BP 119/71 | HR 84 | Ht 61.0 in | Wt 182.2 lb

## 2023-01-15 DIAGNOSIS — E559 Vitamin D deficiency, unspecified: Secondary | ICD-10-CM

## 2023-01-15 DIAGNOSIS — I10 Essential (primary) hypertension: Secondary | ICD-10-CM | POA: Diagnosis not present

## 2023-01-15 DIAGNOSIS — Z1231 Encounter for screening mammogram for malignant neoplasm of breast: Secondary | ICD-10-CM

## 2023-01-15 DIAGNOSIS — Z0000 Encounter for general adult medical examination without abnormal findings: Secondary | ICD-10-CM

## 2023-01-15 DIAGNOSIS — E1165 Type 2 diabetes mellitus with hyperglycemia: Secondary | ICD-10-CM | POA: Diagnosis not present

## 2023-01-15 DIAGNOSIS — E782 Mixed hyperlipidemia: Secondary | ICD-10-CM

## 2023-01-15 DIAGNOSIS — Z13820 Encounter for screening for osteoporosis: Secondary | ICD-10-CM

## 2023-01-15 DIAGNOSIS — R7303 Prediabetes: Secondary | ICD-10-CM | POA: Insufficient documentation

## 2023-01-15 LAB — POC CREATINE & ALBUMIN,URINE
Albumin/Creatinine Ratio, Urine, POC: <30
Creatinine, POC: 50 mg/dL
Microalbumin Ur, POC: 10 mg/L

## 2023-01-15 NOTE — Progress Notes (Signed)
Established Patient Office Visit  Subjective:  Patient ID: Diana Bonilla, female    DOB: 10/11/1960  Age: 62 y.o. MRN: [id number]  Chief Complaint  Patient presents with   Follow-up    3 mo F/U    Patient in office for 3 month follow up. Patient doing well. No complaints today. Reports some lower extremity edema recently that has since resolved.  Colonoscopy was last done in 09/2013, repeat in 10 years. Due for a mammogram, order placed.  Labs at next visit. Pap smear most recent was 11/25/2020. Dexa scan due. Will place an order. Eye exam was 11/2022    No other concerns at this time.   Past Medical History:  Diagnosis Date   Diabetes mellitus without complication (HCC)    Hypertension     History reviewed. No pertinent surgical history.  Social History   Socioeconomic History   Marital status: Married    Spouse name: Not on file   Number of children: Not on file   Years of education: Not on file   Highest education level: Not on file  Occupational History   Not on file  Tobacco Use   Smoking status: Never   Smokeless tobacco: Never  Vaping Use   Vaping Use: Never used  Substance and Sexual Activity   Alcohol use: Not Currently   Drug use: Never   Sexual activity: Not on file  Other Topics Concern   Not on file  Social History Narrative   Not on file   Social Determinants of Health   Financial Resource Strain: Not on file  Food Insecurity: Not on file  Transportation Needs: Not on file  Physical Activity: Not on file  Stress: Not on file  Social Connections: Not on file  Intimate Partner Violence: Not on file    Family History  Problem Relation Age of Onset   Osteoporosis Mother    Hypertension Father    Breast cancer Neg Hx     No Known Allergies  Review of Systems  Constitutional: Negative.   HENT: Negative.    Eyes: Negative.   Respiratory: Negative.  Negative for cough and shortness of breath.   Cardiovascular: Negative.  Negative for  chest pain, palpitations and leg swelling.  Gastrointestinal: Negative.  Negative for abdominal pain, constipation, diarrhea, heartburn, nausea and vomiting.  Genitourinary: Negative.  Negative for dysuria and flank pain.  Musculoskeletal: Negative.  Negative for joint pain and myalgias.  Skin: Negative.   Neurological: Negative.  Negative for dizziness and headaches.  Endo/Heme/Allergies: Negative.   Psychiatric/Behavioral: Negative.  Negative for depression and suicidal ideas. The patient is not nervous/anxious.        Objective:   BP 119/71   Pulse 84   Ht 5\' 1"  (1.549 m)   Wt 182 lb 3.2 oz (82.6 kg)   SpO2 98%   BMI 34.43 kg/m   Vitals:   01/15/23 1532 01/15/23 1549  BP: (!) 140/69 119/71  Pulse: 84   Height: 5\' 1"  (1.549 m)   Weight: 182 lb 3.2 oz (82.6 kg)   SpO2: 98%   BMI (Calculated): 34.44     Physical Exam Vitals and nursing note reviewed.  Constitutional:      Appearance: Normal appearance.  HENT:     Head: Normocephalic and atraumatic.     Nose: Nose normal.     Mouth/Throat:     Mouth: Mucous membranes are moist.     Pharynx: Oropharynx is clear.  Eyes:  Conjunctiva/sclera: Conjunctivae normal.     Pupils: Pupils are equal, round, and reactive to light.  Cardiovascular:     Rate and Rhythm: Normal rate and regular rhythm.     Pulses: Normal pulses.     Heart sounds: Normal heart sounds. No murmur heard. Pulmonary:     Effort: Pulmonary effort is normal.     Breath sounds: Normal breath sounds. No wheezing.  Abdominal:     General: Bowel sounds are normal.     Palpations: Abdomen is soft.     Tenderness: There is no abdominal tenderness. There is no right CVA tenderness or left CVA tenderness.  Musculoskeletal:        General: Normal range of motion.     Cervical back: Normal range of motion.     Right lower leg: No edema.     Left lower leg: No edema.  Skin:    General: Skin is warm and dry.  Neurological:     General: No focal deficit  present.     Mental Status: She is alert and oriented to person, place, and time.  Psychiatric:        Mood and Affect: Mood normal.        Behavior: Behavior normal.      Results for orders placed or performed in visit on 01/15/23  POC CREATINE & ALBUMIN,URINE  Result Value Ref Range   Microalbumin Ur, POC 10 mg/L   Creatinine, POC 50 mg/dL   Albumin/Creatinine Ratio, Urine, POC <30     Recent Results (from the past 2160 hour(s))  CMP14+EGFR     Status: Abnormal   Collection Time: 10/22/22  8:41 AM  Result Value Ref Range   Glucose 94 70 - 99 mg/dL   BUN 10 6 - 24 mg/dL   Creatinine, Ser 0.98 (L) 0.57 - 1.00 mg/dL   eGFR 119 >14 NW/GNF/6.21   BUN/Creatinine Ratio 18 9 - 23   Sodium 144 134 - 144 mmol/L   Potassium 4.4 3.5 - 5.2 mmol/L   Chloride 106 96 - 106 mmol/L   CO2 22 20 - 29 mmol/L   Calcium 9.1 8.7 - 10.2 mg/dL   Total Protein 7.0 6.0 - 8.5 g/dL   Albumin 4.3 3.8 - 4.9 g/dL   Globulin, Total 2.7 1.5 - 4.5 g/dL   Albumin/Globulin Ratio 1.6 1.2 - 2.2   Bilirubin Total 0.2 0.0 - 1.2 mg/dL   Alkaline Phosphatase 92 44 - 121 IU/L   AST 26 0 - 40 IU/L   ALT 30 0 - 32 IU/L  Lipid Panel w/o Chol/HDL Ratio     Status: None   Collection Time: 10/22/22  8:41 AM  Result Value Ref Range   Cholesterol, Total 133 100 - 199 mg/dL   Triglycerides 80 0 - 149 mg/dL   HDL 56 >30 mg/dL   VLDL Cholesterol Cal 16 5 - 40 mg/dL   LDL Chol Calc (NIH) 61 0 - 99 mg/dL  Hemoglobin Q6V     Status: Abnormal   Collection Time: 10/22/22  8:41 AM  Result Value Ref Range   Hgb A1c MFr Bld 6.1 (H) 4.8 - 5.6 %    Comment:          Prediabetes: 5.7 - 6.4          Diabetes: >6.4          Glycemic control for adults with diabetes: <7.0    Est. average glucose Bld gHb Est-mCnc 128 mg/dL  Vitamin D (  25 hydroxy)     Status: None   Collection Time: 10/22/22  8:41 AM  Result Value Ref Range   Vit D, 25-Hydroxy 74.4 30.0 - 100.0 ng/mL    Comment: Vitamin D deficiency has been defined by the  Institute of Medicine and an Endocrine Society practice guideline as a level of serum 25-OH vitamin D less than 20 ng/mL (1,2). The Endocrine Society went on to further define vitamin D insufficiency as a level between 21 and 29 ng/mL (2). 1. IOM (Institute of Medicine). 2010. Dietary reference    intakes for calcium and D. [place]: The    [employer]. 2. Holick MF, Binkley [place], Bischoff-Ferrari HA, et al.    Evaluation, treatment, and prevention of vitamin D    deficiency: an Endocrine Society clinical practice    guideline. JCEM. 2011 Jul; 96(7):1911-30.   POC CREATINE & ALBUMIN,URINE     Status: Normal   Collection Time: 01/15/23  4:07 PM  Result Value Ref Range   Microalbumin Ur, POC 10 mg/L   Creatinine, POC 50 mg/dL   Albumin/Creatinine Ratio, Urine, POC <30       Assessment & Plan:  Patient doing well. No complaints today. Continue all medications. Colonoscopy due next year. Pap smear due next year. Orders for mammogram and bone density placed. Up to date on eye exam. Labs due next month.   Problem List Items Addressed This Visit     Mixed hyperlipidemia   Relevant Orders   CMP14+EGFR   Lipid Profile   Benign essential hypertension - Primary   Relevant Orders   CMP14+EGFR   CBC With Diff/Platelet   Vitamin D deficiency   Relevant Orders   CBC With Diff/Platelet   Vitamin D (25 hydroxy)   Breast cancer screening by mammogram   Relevant Orders   MM 3D SCREENING MAMMOGRAM BILATERAL BREAST   Type 2 diabetes mellitus with hyperglycemia, without long-term current use of insulin (HCC)   Relevant Orders   HgB A1c   CMP14+EGFR   CBC With Diff/Platelet   POC CREATINE & ALBUMIN,URINE (Completed)   Other Visit Diagnoses     Screening for osteoporosis       Relevant Orders   DG Bone Density       Return in about 4 months (around 05/17/2023).   Total time spent: 25 minutes  Margaretann Loveless, MD  01/15/2023   This document may have been  prepared by [facility] Voice Recognition software and as such may include unintentional dictation errors.

## 2023-01-15 NOTE — Progress Notes (Signed)
Established Patient Office Visit  Subjective:  Patient ID: Diana Bonilla, female    DOB: December 23, 1960  Age: 62 y.o. MRN: [id number]  No chief complaint on file.   Patient is here for follow up and Annual physical. Generally feels well, no new complaints. Needs Mammo and BMD scheduled. Colon and PAP will be due in 2025. Patient will return for fasting labs.    No other concerns at this time.   Past Medical History:  Diagnosis Date   Diabetes mellitus without complication (HCC)    Hypertension     History reviewed. No pertinent surgical history.  Social History   Socioeconomic History   Marital status: Married    Spouse name: Not on file   Number of children: Not on file   Years of education: Not on file   Highest education level: Not on file  Occupational History   Not on file  Tobacco Use   Smoking status: Never   Smokeless tobacco: Never  Vaping Use   Vaping Use: Never used  Substance and Sexual Activity   Alcohol use: Not Currently   Drug use: Never   Sexual activity: Not on file  Other Topics Concern   Not on file  Social History Narrative   Not on file   Social Determinants of Health   Financial Resource Strain: Not on file  Food Insecurity: Not on file  Transportation Needs: Not on file  Physical Activity: Not on file  Stress: Not on file  Social Connections: Not on file  Intimate Partner Violence: Not on file    Family History  Problem Relation Age of Onset   Osteoporosis Mother    Hypertension Father    Breast cancer Neg Hx     No Known Allergies  Review of Systems  Constitutional:  Negative for chills, fever, malaise/fatigue and weight loss.  HENT:  Negative for ear discharge, hearing loss, sinus pain, sore throat and tinnitus.   Eyes:  Negative for blurred vision, double vision, photophobia, pain and discharge.  Respiratory:  Negative for cough, hemoptysis, sputum production, shortness of breath and stridor.   Cardiovascular:   Negative for chest pain, palpitations, orthopnea, claudication, leg swelling and PND.  Gastrointestinal:  Negative for constipation, heartburn, melena and nausea.  Genitourinary:  Negative for dysuria, flank pain, frequency, hematuria and urgency.  Skin:  Negative for itching and rash.  Neurological:  Negative for dizziness, tremors, sensory change, speech change, focal weakness, weakness and headaches.  Psychiatric/Behavioral:  Negative for depression, hallucinations, memory loss, substance abuse and suicidal ideas. The patient is not nervous/anxious and does not have insomnia.        Objective:   BP 119/71   Pulse 84   Ht 5\' 1"  (1.549 m)   Wt 182 lb (82.6 kg)   SpO2 98%   BMI 34.39 kg/m   Vitals:   01/15/23 1636  BP: 119/71  Pulse: 84  Height: 5\' 1"  (1.549 m)  Weight: 182 lb (82.6 kg)  SpO2: 98%  BMI (Calculated): 34.41    Physical Exam Vitals and nursing note reviewed.  Constitutional:      Appearance: Normal appearance. She is obese.  HENT:     Head: Normocephalic and atraumatic.     Right Ear: Tympanic membrane normal.     Left Ear: Tympanic membrane normal.     Nose: Nose normal.     Mouth/Throat:     Mouth: Mucous membranes are moist.     Pharynx: Oropharynx is clear.  Eyes:  Conjunctiva/sclera: Conjunctivae normal.     Pupils: Pupils are equal, round, and reactive to light.  Cardiovascular:     Rate and Rhythm: Normal rate and regular rhythm.     Pulses: Normal pulses.     Heart sounds: Normal heart sounds. No murmur heard.    No gallop.  Pulmonary:     Effort: Pulmonary effort is normal.     Breath sounds: Normal breath sounds. No wheezing.  Abdominal:     General: Bowel sounds are normal.     Palpations: Abdomen is soft.     Tenderness: There is no abdominal tenderness. There is no right CVA tenderness or left CVA tenderness.  Musculoskeletal:        General: Normal range of motion.     Cervical back: Normal range of motion and neck supple.      Right lower leg: No edema.     Left lower leg: No edema.  Lymphadenopathy:     Cervical: No cervical adenopathy.  Skin:    General: Skin is warm and dry.  Neurological:     General: No focal deficit present.     Mental Status: She is alert and oriented to person, place, and time.  Psychiatric:        Mood and Affect: Mood normal.        Behavior: Behavior normal.      Results for orders placed or performed in visit on 01/15/23  POC CREATINE & ALBUMIN,URINE  Result Value Ref Range   Microalbumin Ur, POC 10 mg/L   Creatinine, POC 50 mg/dL   Albumin/Creatinine Ratio, Urine, POC <30         Assessment & Plan:  Schedule mammo and BMD. Continue meds. Fasting labs next month. Problem List Items Addressed This Visit     Mixed hyperlipidemia   Benign essential hypertension - Primary   Vitamin D deficiency   Type 2 diabetes mellitus with hyperglycemia, without long-term current use of insulin (HCC)   Other Visit Diagnoses     Annual physical exam           Follow up 3 months.  Total time spent: 30 minutes  Margaretann Loveless, MD  01/15/2023   This document may have been prepared by [facility] Voice Recognition software and as such may include unintentional dictation errors.

## 2023-02-04 ENCOUNTER — Encounter: Payer: Self-pay | Admitting: Internal Medicine

## 2023-02-10 ENCOUNTER — Other Ambulatory Visit: Payer: BC Managed Care – PPO

## 2023-02-10 ENCOUNTER — Other Ambulatory Visit: Payer: Self-pay

## 2023-02-10 DIAGNOSIS — I10 Essential (primary) hypertension: Secondary | ICD-10-CM

## 2023-02-10 DIAGNOSIS — E1165 Type 2 diabetes mellitus with hyperglycemia: Secondary | ICD-10-CM

## 2023-02-10 DIAGNOSIS — E782 Mixed hyperlipidemia: Secondary | ICD-10-CM

## 2023-02-10 DIAGNOSIS — E559 Vitamin D deficiency, unspecified: Secondary | ICD-10-CM

## 2023-02-11 LAB — CMP14+EGFR
ALT: 13 IU/L (ref 0–32)
AST: 16 IU/L (ref 0–40)
Alkaline Phosphatase: 94 IU/L (ref 44–121)
BUN/Creatinine Ratio: 19 (ref 12–28)
BUN: 10 mg/dL (ref 8–27)
Bilirubin Total: 0.2 mg/dL (ref 0.0–1.2)
Calcium: 9.2 mg/dL (ref 8.7–10.3)
Chloride: 103 mmol/L (ref 96–106)
Creatinine, Ser: 0.53 mg/dL — ABNORMAL LOW (ref 0.57–1.00)
Globulin, Total: 3 g/dL (ref 1.5–4.5)
Glucose: 83 mg/dL (ref 70–99)
Sodium: 140 mmol/L (ref 134–144)
Total Protein: 7 g/dL (ref 6.0–8.5)
eGFR: 106 mL/min/{1.73_m2} (ref >59)

## 2023-02-11 LAB — CBC WITH DIFF/PLATELET
Basophils Absolute: 0.1 10*3/uL (ref 0.0–0.2)
Basos: 1 %
EOS (ABSOLUTE): 0.1 10*3/uL (ref 0.0–0.4)
Eos: 1 %
Hematocrit: 39.9 % (ref 34.0–46.6)
Hemoglobin: 12.5 g/dL (ref 11.1–15.9)
Immature Granulocytes: 0 %
Lymphocytes Absolute: 2 10*3/uL (ref 0.7–3.1)
Lymphs: 19 %
MCH: 27.1 pg (ref 26.6–33.0)
MCHC: 31.3 g/dL — ABNORMAL LOW (ref 31.5–35.7)
MCV: 87 fL (ref 79–97)
Monocytes: 6 %
Neutrophils Absolute: 7.6 10*3/uL — ABNORMAL HIGH (ref 1.4–7.0)
Neutrophils: 73 %
Platelets: 363 10*3/uL (ref 150–450)
RDW: 14.1 % (ref 11.7–15.4)
WBC: 10.4 10*3/uL (ref 3.4–10.8)

## 2023-02-11 LAB — VITAMIN D 25 HYDROXY (VIT D DEFICIENCY, FRACTURES): Vit D, 25-Hydroxy: 58.8 ng/mL (ref 30.0–100.0)

## 2023-02-11 LAB — LIPID PANEL
Chol/HDL Ratio: 2.1 ratio (ref 0.0–4.4)
Cholesterol, Total: 130 mg/dL (ref 100–199)
LDL Chol Calc (NIH): 52 mg/dL (ref 0–99)
Triglycerides: 89 mg/dL (ref 0–149)
VLDL Cholesterol Cal: 17 mg/dL (ref 5–40)

## 2023-02-11 LAB — HEMOGLOBIN A1C
Est. average glucose Bld gHb Est-mCnc: 128 mg/dL
Hgb A1c MFr Bld: 6.1 % — ABNORMAL HIGH (ref 4.8–5.6)

## 2023-02-11 NOTE — Progress Notes (Signed)
Patient notified

## 2023-03-12 ENCOUNTER — Other Ambulatory Visit: Payer: Self-pay | Admitting: Internal Medicine

## 2023-03-18 ENCOUNTER — Ambulatory Visit
Admission: RE | Admit: 2023-03-18 | Discharge: 2023-03-18 | Disposition: A | Discharge status: Home / Self Care | Payer: BC Managed Care – PPO | Source: Ambulatory Visit | Attending: Internal Medicine | Admitting: Internal Medicine

## 2023-03-18 DIAGNOSIS — Z1231 Encounter for screening mammogram for malignant neoplasm of breast: Secondary | ICD-10-CM | POA: Diagnosis present

## 2023-03-18 DIAGNOSIS — Z13820 Encounter for screening for osteoporosis: Secondary | ICD-10-CM | POA: Diagnosis present

## 2023-03-23 NOTE — Progress Notes (Signed)
Patient notified

## 2023-04-13 ENCOUNTER — Other Ambulatory Visit: Payer: Self-pay | Admitting: Internal Medicine

## 2023-04-13 DIAGNOSIS — I10 Essential (primary) hypertension: Secondary | ICD-10-CM

## 2023-05-17 ENCOUNTER — Other Ambulatory Visit: Payer: Self-pay | Admitting: Internal Medicine

## 2023-05-18 ENCOUNTER — Ambulatory Visit: Payer: BC Managed Care – PPO | Admitting: Internal Medicine

## 2023-05-20 ENCOUNTER — Other Ambulatory Visit: Payer: Self-pay

## 2023-05-20 MED ORDER — VERAPAMIL HCL ER 240 MG PO TBCR: 240.0000 mg | EXTENDED_RELEASE_TABLET | Freq: Every day | ORAL | 0 refills | Status: DC

## 2023-05-25 ENCOUNTER — Ambulatory Visit: Payer: BC Managed Care – PPO | Admitting: Internal Medicine

## 2023-05-25 ENCOUNTER — Encounter: Payer: Self-pay | Admitting: Internal Medicine

## 2023-05-25 VITALS — BP 126/72 | HR 62 | Ht 61.0 in | Wt 189.8 lb

## 2023-05-25 DIAGNOSIS — E1159 Type 2 diabetes mellitus with other circulatory complications: Secondary | ICD-10-CM | POA: Diagnosis not present

## 2023-05-25 DIAGNOSIS — E559 Vitamin D deficiency, unspecified: Secondary | ICD-10-CM

## 2023-05-25 DIAGNOSIS — E782 Mixed hyperlipidemia: Secondary | ICD-10-CM

## 2023-05-25 DIAGNOSIS — E1165 Type 2 diabetes mellitus with hyperglycemia: Secondary | ICD-10-CM

## 2023-05-25 DIAGNOSIS — I152 Hypertension secondary to endocrine disorders: Secondary | ICD-10-CM

## 2023-05-25 DIAGNOSIS — E1169 Type 2 diabetes mellitus with other specified complication: Secondary | ICD-10-CM

## 2023-05-25 LAB — POCT CBG (FASTING - GLUCOSE)-MANUAL ENTRY: Glucose Fasting, POC: 104 mg/dL — AB (ref 70–99)

## 2023-05-25 NOTE — Progress Notes (Signed)
Established Patient Office Visit  Subjective:  Patient ID: Diana Bonilla, female    DOB: 1961/05/08  Age: 62 y.o. MRN: [id number]  Chief Complaint  Patient presents with   Follow-up    4 month follow up    Patient is here for her follow-up today.  She has gained weight but admits to eating a lot while she was away on vacation.  She will start monitoring her diet for now on.  She is fasting for blood work. Up-to-date on mammogram and DEXA. Colonoscopy and Pap are due next year. Will get her urine microalbumin today as well.    No other concerns at this time.   Past Medical History:  Diagnosis Date   Diabetes mellitus without complication (HCC)    Hypertension     History reviewed. No pertinent surgical history.  Social History   Socioeconomic History   Marital status: Married    Spouse name: Not on file   Number of children: Not on file   Years of education: Not on file   Highest education level: Not on file  Occupational History   Not on file  Tobacco Use   Smoking status: Never   Smokeless tobacco: Never  Vaping Use   Vaping status: Never Used  Substance and Sexual Activity   Alcohol use: Not Currently   Drug use: Never   Sexual activity: Not on file  Other Topics Concern   Not on file  Social History Narrative   Not on file   Social Determinants of Health   Financial Resource Strain: Not on file  Food Insecurity: Not on file  Transportation Needs: Not on file  Physical Activity: Not on file  Stress: Not on file  Social Connections: Not on file  Intimate Partner Violence: Not on file    Family History  Problem Relation Age of Onset   Osteoporosis Mother    Hypertension Father    Breast cancer Neg Hx     No Known Allergies  Review of Systems  Constitutional: Negative.  Negative for chills, diaphoresis, fever, malaise/fatigue and weight loss.  HENT: Negative.  Negative for sinus pain and sore throat.   Eyes: Negative.   Respiratory:  Negative.  Negative for cough and shortness of breath.   Cardiovascular: Negative.  Negative for chest pain, palpitations and leg swelling.  Gastrointestinal: Negative.  Negative for abdominal pain, constipation, diarrhea, heartburn, nausea and vomiting.  Genitourinary: Negative.  Negative for dysuria and flank pain.  Musculoskeletal: Negative.  Negative for joint pain and myalgias.  Skin: Negative.   Neurological: Negative.  Negative for dizziness and headaches.  Endo/Heme/Allergies: Negative.   Psychiatric/Behavioral: Negative.  Negative for depression and suicidal ideas. The patient is not nervous/anxious.        Objective:   BP 126/72   Pulse 62   Ht 5\' 1"  (1.549 m)   Wt 189 lb 12.8 oz (86.1 kg)   SpO2 97%   BMI 35.86 kg/m   Vitals:   05/25/23 1514  BP: 126/72  Pulse: 62  Height: 5\' 1"  (1.549 m)  Weight: 189 lb 12.8 oz (86.1 kg)  SpO2: 97%  BMI (Calculated): 35.88    Physical Exam Vitals and nursing note reviewed.  Constitutional:      Appearance: Normal appearance.  HENT:     Head: Normocephalic and atraumatic.     Nose: Nose normal.     Mouth/Throat:     Mouth: Mucous membranes are moist.     Pharynx: Oropharynx is clear.  Eyes:     Conjunctiva/sclera: Conjunctivae normal.     Pupils: Pupils are equal, round, and reactive to light.  Cardiovascular:     Rate and Rhythm: Normal rate and regular rhythm.     Pulses: Normal pulses.     Heart sounds: Normal heart sounds. No murmur heard. Pulmonary:     Effort: Pulmonary effort is normal.     Breath sounds: Normal breath sounds. No wheezing.  Abdominal:     General: Bowel sounds are normal.     Palpations: Abdomen is soft.     Tenderness: There is no abdominal tenderness. There is no right CVA tenderness or left CVA tenderness.  Musculoskeletal:        General: Normal range of motion.     Cervical back: Normal range of motion.     Right lower leg: No edema.     Left lower leg: No edema.  Skin:    General:  Skin is warm and dry.  Neurological:     General: No focal deficit present.     Mental Status: She is alert and oriented to person, place, and time.  Psychiatric:        Mood and Affect: Mood normal.        Behavior: Behavior normal.      Results for orders placed or performed in visit on 05/25/23  POCT CBG (Fasting - Glucose)  Result Value Ref Range   Glucose Fasting, POC 104 (A) 70 - 99 mg/dL    Recent Results (from the past 2160 hour(s))  POCT CBG (Fasting - Glucose)     Status: Abnormal   Collection Time: 05/25/23  3:17 PM  Result Value Ref Range   Glucose Fasting, POC 104 (A) 70 - 99 mg/dL      Assessment & Plan:  Patient advised to continue taking her medications. Needs strict diet control and weight reduction.  Will resume her Zumba classes as well. Labs today. Problem List Items Addressed This Visit     Vitamin D deficiency   Type 2 diabetes mellitus with hyperglycemia, without long-term current use of insulin (HCC)   Relevant Orders   POCT CBG (Fasting - Glucose) (Completed)   Hemoglobin A1c   Microalbumin, urine   Hypertension associated with diabetes (HCC) - Primary   Relevant Orders   CMP14+EGFR   Hemoglobin A1c   Combined hyperlipidemia associated with type 2 diabetes mellitus (HCC)   Relevant Orders   Lipid Panel w/o Chol/HDL Ratio    Return in about 4 months (around 09/22/2023).   Total time spent: 30 minutes  Margaretann Loveless, MD  05/25/2023   This document may have been prepared by [facility] Voice Recognition software and as such may include unintentional dictation errors.

## 2023-05-26 LAB — CMP14+EGFR
ALT: 15 [IU]/L (ref 0–32)
AST: 16 [IU]/L (ref 0–40)
Albumin: 4.1 g/dL (ref 3.8–4.9)
Alkaline Phosphatase: 101 [IU]/L (ref 44–121)
BUN/Creatinine Ratio: 18 (ref 12–28)
BUN: 12 mg/dL (ref 8–27)
Bilirubin Total: <0.2 mg/dL (ref 0.0–1.2)
CO2: 23 mmol/L (ref 20–29)
Calcium: 9.3 mg/dL (ref 8.7–10.3)
Chloride: 101 mmol/L (ref 96–106)
Creatinine, Ser: 0.66 mg/dL (ref 0.57–1.00)
Globulin, Total: 3 g/dL (ref 1.5–4.5)
Glucose: 96 mg/dL (ref 70–99)
Potassium: 4.6 mmol/L (ref 3.5–5.2)
Sodium: 141 mmol/L (ref 134–144)
Total Protein: 7.1 g/dL (ref 6.0–8.5)
eGFR: 100 mL/min/{1.73_m2} (ref >59)

## 2023-05-26 LAB — LIPID PANEL W/O CHOL/HDL RATIO
Cholesterol, Total: 151 mg/dL (ref 100–199)
HDL: 68 mg/dL (ref >39)
LDL Chol Calc (NIH): 64 mg/dL (ref 0–99)
Triglycerides: 104 mg/dL (ref 0–149)
VLDL Cholesterol Cal: 19 mg/dL (ref 5–40)

## 2023-05-26 LAB — HEMOGLOBIN A1C
Est. average glucose Bld gHb Est-mCnc: 131 mg/dL
Hgb A1c MFr Bld: 6.2 % — ABNORMAL HIGH (ref 4.8–5.6)

## 2023-05-26 LAB — MICROALBUMIN, URINE: Microalbumin, Urine: <3 ug/mL

## 2023-05-27 NOTE — Progress Notes (Signed)
Patient notified

## 2023-08-03 ENCOUNTER — Ambulatory Visit: Payer: BC Managed Care – PPO | Admitting: Internal Medicine

## 2023-08-03 ENCOUNTER — Encounter: Payer: Self-pay | Admitting: Internal Medicine

## 2023-08-03 VITALS — BP 108/76 | HR 70 | Ht 61.0 in | Wt 184.4 lb

## 2023-08-03 DIAGNOSIS — E1165 Type 2 diabetes mellitus with hyperglycemia: Secondary | ICD-10-CM

## 2023-08-03 DIAGNOSIS — I152 Hypertension secondary to endocrine disorders: Secondary | ICD-10-CM

## 2023-08-03 DIAGNOSIS — L03115 Cellulitis of right lower limb: Secondary | ICD-10-CM

## 2023-08-03 DIAGNOSIS — E1159 Type 2 diabetes mellitus with other circulatory complications: Secondary | ICD-10-CM | POA: Diagnosis not present

## 2023-08-03 DIAGNOSIS — E1169 Type 2 diabetes mellitus with other specified complication: Secondary | ICD-10-CM

## 2023-08-03 DIAGNOSIS — E782 Mixed hyperlipidemia: Secondary | ICD-10-CM

## 2023-08-03 DIAGNOSIS — R600 Localized edema: Secondary | ICD-10-CM

## 2023-08-03 LAB — POCT CBG (FASTING - GLUCOSE)-MANUAL ENTRY: Glucose Fasting, POC: 92 mg/dL (ref 70–99)

## 2023-08-03 MED ORDER — FUROSEMIDE 20 MG PO TABS: 20.0000 mg | ORAL_TABLET | Freq: Every day | ORAL | 0 refills | Status: DC

## 2023-08-03 MED ORDER — CEPHALEXIN 500 MG PO CAPS
500.0000 mg | ORAL_CAPSULE | Freq: Three times a day (TID) | ORAL | 0 refills | Status: AC
Start: 2023-08-03 — End: 2023-08-13

## 2023-08-03 NOTE — Progress Notes (Signed)
 Established Patient Office Visit  Subjective:  Patient ID: Diana Bonilla, female    DOB: 07/07/1961  Age: 63 y.o. MRN: [id number]  Chief Complaint  Patient presents with   Foot Swelling    Right worse than left    Patient comes in with 2-week history of swelling and skin tightness of both her lower legs, especially the right lower leg.  Patient reports that she is okay when she is at home but after being at work and standing for long hours she notices the swelling.  She had a similar episode in May 2023, at that time venous Dopplers were negative for blood clots.  And she was treated with extra dose of Lasix  for a few days.  Patient does not have any pain or tenderness of the calves.  She was advised to wear compression stockings back then but she is not doing it.  Denies chest pain or shortness of breath. The right lower leg has slight redness, will treat with Keflex  for mild cellulitis and add Lasix  for a few days.  Patient advised to keep her legs elevated, and start wearing compression stockings when she goes to work..    No other concerns at this time.   Past Medical History:  Diagnosis Date   Diabetes mellitus without complication (HCC)    Hypertension     History reviewed. No pertinent surgical history.  Social History   Socioeconomic History   Marital status: Married    Spouse name: Not on file   Number of children: Not on file   Years of education: Not on file   Highest education level: Not on file  Occupational History   Not on file  Tobacco Use   Smoking status: Never   Smokeless tobacco: Never  Vaping Use   Vaping status: Never Used  Substance and Sexual Activity   Alcohol use: Not Currently   Drug use: Never   Sexual activity: Not on file  Other Topics Concern   Not on file  Social History Narrative   Not on file   Social Drivers of Health   Financial Resource Strain: Not on file  Food Insecurity: Not on file  Transportation Needs: Not on file   Physical Activity: Not on file  Stress: Not on file  Social Connections: Not on file  Intimate Partner Violence: Not on file    Family History  Problem Relation Age of Onset   Osteoporosis Mother    Hypertension Father    Breast cancer Neg Hx     No Known Allergies  Outpatient Medications Prior to Visit  Medication Sig   amLODipine  (NORVASC ) 5 MG tablet TAKE 1 TABLET BY MOUTH DAILY   Cholecalciferol (VITAMIN D3) 1.25 MG (50000 UT) CAPS Take 1 capsule by mouth once a week.   losartan -hydrochlorothiazide (HYZAAR) 100-12.5 MG tablet TAKE 1 TABLET BY MOUTH DAILY   metFORMIN (GLUCOPHAGE-XR) 500 MG 24 hr tablet TAKE 1 TABLET BY MOUTH TWICE A DAY   sertraline (ZOLOFT) 50 MG tablet TAKE 1 TABLET BY MOUTH DAILY   simvastatin (ZOCOR) 20 MG tablet TAKE 1 TABLET BY MOUTH DAILY   verapamil  (CALAN -SR) 240 MG CR tablet Take 1 tablet (240 mg total) by mouth daily.   baclofen  (LIORESAL ) 10 MG tablet Take 1 tablet (10 mg total) by mouth daily. (Patient not taking: Reported on 08/03/2023)   meloxicam  (MOBIC ) 15 MG tablet Take 1 tablet (15 mg total) by mouth daily as needed. (Patient not taking: Reported on 08/03/2023)   No  facility-administered medications prior to visit.    Review of Systems  Constitutional: Negative.  Negative for chills, fever, malaise/fatigue and weight loss.  HENT: Negative.  Negative for congestion and sinus pain.   Eyes: Negative.   Respiratory: Negative.  Negative for cough, shortness of breath and stridor.   Cardiovascular: Negative.  Negative for chest pain, palpitations and leg swelling.  Gastrointestinal: Negative.  Negative for abdominal pain, constipation, diarrhea, heartburn, nausea and vomiting.  Genitourinary: Negative.  Negative for dysuria and flank pain.  Musculoskeletal: Negative.  Negative for joint pain and myalgias.  Skin: Negative.   Neurological: Negative.  Negative for dizziness and headaches.  Endo/Heme/Allergies: Negative.   Psychiatric/Behavioral:  Negative.  Negative for depression and suicidal ideas. The patient is not nervous/anxious.        Objective:   BP 108/76   Pulse 70   Ht 5' 1 (1.549 m)   Wt 184 lb 6.4 oz (83.6 kg)   SpO2 99%   BMI 34.84 kg/m   Vitals:   08/03/23 1511  BP: 108/76  Pulse: 70  Height: 5' 1 (1.549 m)  Weight: 184 lb 6.4 oz (83.6 kg)  SpO2: 99%  BMI (Calculated): 34.86    Physical Exam Vitals and nursing note reviewed.  Constitutional:      Appearance: Normal appearance.  HENT:     Head: Normocephalic and atraumatic.     Nose: Nose normal.     Mouth/Throat:     Mouth: Mucous membranes are moist.     Pharynx: Oropharynx is clear.  Eyes:     Conjunctiva/sclera: Conjunctivae normal.     Pupils: Pupils are equal, round, and reactive to light.  Cardiovascular:     Rate and Rhythm: Normal rate and regular rhythm.     Pulses: Normal pulses.     Heart sounds: Normal heart sounds. No murmur heard. Pulmonary:     Effort: Pulmonary effort is normal.     Breath sounds: Normal breath sounds. No wheezing.  Abdominal:     General: Bowel sounds are normal.     Palpations: Abdomen is soft.     Tenderness: There is no abdominal tenderness. There is no right CVA tenderness or left CVA tenderness.  Musculoskeletal:        General: Normal range of motion.     Cervical back: Normal range of motion.     Right lower leg: Edema present.     Left lower leg: Edema present.  Skin:    General: Skin is warm and dry.  Neurological:     General: No focal deficit present.     Mental Status: She is alert and oriented to person, place, and time.  Psychiatric:        Mood and Affect: Mood normal.        Behavior: Behavior normal.      Results for orders placed or performed in visit on 08/03/23  POCT CBG (Fasting - Glucose)  Result Value Ref Range   Glucose Fasting, POC 92 70 - 99 mg/dL    Recent Results (from the past 2160 hours)  POCT CBG (Fasting - Glucose)     Status: Abnormal   Collection  Time: 05/25/23  3:17 PM  Result Value Ref Range   Glucose Fasting, POC 104 (A) 70 - 99 mg/dL  [id number]     Status: None   Collection Time: 05/25/23  3:39 PM  Result Value Ref Range   Glucose 96 70 - 99 mg/dL   BUN 12 8 -  27 mg/dL   Creatinine, Ser 9.33 0.57 - 1.00 mg/dL   eGFR 899 >40 fO/fpw/8.26   BUN/Creatinine Ratio 18 12 - 28   Sodium 141 134 - 144 mmol/L   Potassium 4.6 3.5 - 5.2 mmol/L   Chloride 101 96 - 106 mmol/L   CO2 23 20 - 29 mmol/L   Calcium 9.3 8.7 - 10.3 mg/dL   Total Protein 7.1 6.0 - 8.5 g/dL   Albumin 4.1 3.8 - 4.9 g/dL   Globulin, Total 3.0 1.5 - 4.5 g/dL   Bilirubin Total <9.7 0.0 - 1.2 mg/dL   Alkaline Phosphatase 101 44 - 121 IU/L   AST 16 0 - 40 IU/L   ALT 15 0 - 32 IU/L  Lipid Panel w/o Chol/HDL Ratio     Status: None   Collection Time: 05/25/23  3:39 PM  Result Value Ref Range   Cholesterol, Total 151 100 - 199 mg/dL   Triglycerides 895 0 - 149 mg/dL   HDL 68 >60 mg/dL   VLDL Cholesterol Cal 19 5 - 40 mg/dL   LDL Chol Calc (NIH) 64 0 - 99 mg/dL  Hemoglobin J8r     Status: Abnormal   Collection Time: 05/25/23  3:39 PM  Result Value Ref Range   Hgb A1c MFr Bld 6.2 (H) 4.8 - 5.6 %    Comment:          Prediabetes: 5.7 - 6.4          Diabetes: >6.4          Glycemic control for adults with diabetes: <7.0    Est. average glucose Bld gHb Est-mCnc 131 mg/dL  Microalbumin, urine     Status: None   Collection Time: 05/25/23  3:48 PM  Result Value Ref Range   Microalbumin, Urine <3.0 Not Estab. ug/mL  POCT CBG (Fasting - Glucose)     Status: None   Collection Time: 08/03/23  3:22 PM  Result Value Ref Range   Glucose Fasting, POC 92 70 - 99 mg/dL      Assessment & Plan:  Add p.o. Lasix  and Keflex .  Continue other medications.  Patient to return sooner if it gets worse. Problem List Items Addressed This Visit     Type 2 diabetes mellitus with hyperglycemia, without long-term current use of insulin (HCC)   Relevant Orders   POCT CBG (Fasting -  Glucose) (Completed)   Hypertension associated with diabetes (HCC)   Relevant Medications   furosemide  (LASIX ) 20 MG tablet   Combined hyperlipidemia associated with type 2 diabetes mellitus (HCC)   Relevant Medications   furosemide  (LASIX ) 20 MG tablet   Other Visit Diagnoses       Cellulitis of right lower extremity    -  Primary   Relevant Medications   cephALEXin  (KEFLEX ) 500 MG capsule     Lower leg edema       Relevant Medications   furosemide  (LASIX ) 20 MG tablet       Return in about 10 days (around 08/13/2023).   Total time spent: 30 minutes  FERNAND FREDY RAMAN, MD  08/03/2023   This document may have been prepared by [facility] Voice Recognition software and as such may include unintentional dictation errors.

## 2023-08-13 ENCOUNTER — Encounter: Payer: Self-pay | Admitting: Internal Medicine

## 2023-08-13 ENCOUNTER — Ambulatory Visit: Payer: BC Managed Care – PPO | Admitting: Internal Medicine

## 2023-08-13 VITALS — BP 132/68 | HR 70 | Ht 61.0 in | Wt 187.6 lb

## 2023-08-13 DIAGNOSIS — E782 Mixed hyperlipidemia: Secondary | ICD-10-CM

## 2023-08-13 DIAGNOSIS — I152 Hypertension secondary to endocrine disorders: Secondary | ICD-10-CM

## 2023-08-13 DIAGNOSIS — R600 Localized edema: Secondary | ICD-10-CM | POA: Diagnosis not present

## 2023-08-13 DIAGNOSIS — E559 Vitamin D deficiency, unspecified: Secondary | ICD-10-CM

## 2023-08-13 DIAGNOSIS — E1165 Type 2 diabetes mellitus with hyperglycemia: Secondary | ICD-10-CM

## 2023-08-13 DIAGNOSIS — E1159 Type 2 diabetes mellitus with other circulatory complications: Secondary | ICD-10-CM

## 2023-08-13 DIAGNOSIS — E1169 Type 2 diabetes mellitus with other specified complication: Secondary | ICD-10-CM

## 2023-08-13 LAB — POCT CBG (FASTING - GLUCOSE)-MANUAL ENTRY: Glucose Fasting, POC: 104 mg/dL — AB (ref 70–99)

## 2023-08-13 NOTE — Progress Notes (Signed)
Established Patient Office Visit  Subjective:  Patient ID: Diana Bonilla, female    DOB: [birth year]  Age: 63 y.o. MRN: [id number]  Chief Complaint  Patient presents with   Follow-up    10 day follow up    Patient comes in for her 10-day follow-up.  She was being treated for right lower leg edema and mild cellulitis.  She has completed her Keflex and is taking Lasix.  Her swelling is gone and the redness has improved significantly.  She is also wearing her compression stockings.  Patient advised to continue taking Lasix only on Mondays and Thursdays.  She is on a daily dose of Hyzaar 100/12.5 for her blood pressure control.  She generally feels well and has no new complaints.    No other concerns at this time.   Past Medical History:  Diagnosis Date   Diabetes mellitus without complication (HCC)    Hypertension     History reviewed. No pertinent surgical history.  Social History   Socioeconomic History   Marital status: Married    Spouse name: Not on file   Number of children: Not on file   Years of education: Not on file   Highest education level: Not on file  Occupational History   Not on file  Tobacco Use   Smoking status: Never   Smokeless tobacco: Never  Vaping Use   Vaping status: Never Used  Substance and Sexual Activity   Alcohol use: Not Currently   Drug use: Never   Sexual activity: Not on file  Other Topics Concern   Not on file  Social History Narrative   Not on file   Social Drivers of Health   Financial Resource Strain: Not on file  Food Insecurity: Not on file  Transportation Needs: Not on file  Physical Activity: Not on file  Stress: Not on file  Social Connections: Not on file  Intimate Partner Violence: Not on file    Family History  Problem Relation Age of Onset   Osteoporosis Mother    Hypertension Father    Breast cancer Neg Hx     No Known Allergies  Outpatient Medications Prior to Visit  Medication Sig   amLODipine  (NORVASC) 5 MG tablet TAKE 1 TABLET BY MOUTH DAILY   Cholecalciferol (VITAMIN D3) 1.25 MG (50000 UT) CAPS Take 1 capsule by mouth once a week.   furosemide (LASIX) 20 MG tablet Take 1 tablet (20 mg total) by mouth daily.   losartan-hydrochlorothiazide (HYZAAR) 100-12.5 MG tablet TAKE 1 TABLET BY MOUTH DAILY   metFORMIN (GLUCOPHAGE-XR) 500 MG 24 hr tablet TAKE 1 TABLET BY MOUTH TWICE A DAY   sertraline (ZOLOFT) 50 MG tablet TAKE 1 TABLET BY MOUTH DAILY   simvastatin (ZOCOR) 20 MG tablet TAKE 1 TABLET BY MOUTH DAILY   verapamil (CALAN-SR) 240 MG CR tablet Take 1 tablet (240 mg total) by mouth daily.   baclofen (LIORESAL) 10 MG tablet Take 1 tablet (10 mg total) by mouth daily. (Patient not taking: Reported on 08/13/2023)   cephALEXin (KEFLEX) 500 MG capsule Take 1 capsule (500 mg total) by mouth 3 (three) times daily for 10 days. (Patient not taking: Reported on 08/13/2023)   meloxicam (MOBIC) 15 MG tablet Take 1 tablet (15 mg total) by mouth daily as needed. (Patient not taking: Reported on 05/25/2023)   No facility-administered medications prior to visit.    Review of Systems  Constitutional: Negative.  Negative for chills, diaphoresis, fever, malaise/fatigue and weight loss.  HENT:  Negative.  Negative for congestion and sinus pain.   Eyes: Negative.   Respiratory: Negative.  Negative for cough and shortness of breath.   Cardiovascular: Negative.  Negative for chest pain, palpitations and leg swelling.  Gastrointestinal: Negative.  Negative for abdominal pain, constipation, diarrhea, heartburn, nausea and vomiting.  Genitourinary: Negative.  Negative for dysuria and flank pain.  Musculoskeletal: Negative.  Negative for joint pain and myalgias.  Skin: Negative.   Neurological: Negative.  Negative for dizziness and headaches.  Endo/Heme/Allergies: Negative.   Psychiatric/Behavioral: Negative.  Negative for depression and suicidal ideas. The patient is not nervous/anxious.        Objective:    BP 132/68   Pulse 70   Ht 5\' 1"  (1.549 m)   Wt 187 lb 9.6 oz (85.1 kg)   SpO2 98%   BMI 35.45 kg/m   Vitals:   08/13/23 1514  BP: 132/68  Pulse: 70  Height: 5\' 1"  (1.549 m)  Weight: 187 lb 9.6 oz (85.1 kg)  SpO2: 98%  BMI (Calculated): 35.47    Physical Exam Vitals and nursing note reviewed.  Constitutional:      Appearance: Normal appearance.  HENT:     Head: Normocephalic and atraumatic.     Nose: Nose normal.     Mouth/Throat:     Mouth: Mucous membranes are moist.     Pharynx: Oropharynx is clear.  Eyes:     Conjunctiva/sclera: Conjunctivae normal.     Pupils: Pupils are equal, round, and reactive to light.  Cardiovascular:     Rate and Rhythm: Normal rate and regular rhythm.     Pulses: Normal pulses.     Heart sounds: Normal heart sounds. No murmur heard. Pulmonary:     Effort: Pulmonary effort is normal.     Breath sounds: Normal breath sounds. No wheezing.  Abdominal:     General: Bowel sounds are normal.     Palpations: Abdomen is soft.     Tenderness: There is no abdominal tenderness. There is no right CVA tenderness or left CVA tenderness.  Musculoskeletal:        General: Normal range of motion.     Cervical back: Normal range of motion.     Right lower leg: No edema.     Left lower leg: No edema.  Skin:    General: Skin is warm and dry.  Neurological:     General: No focal deficit present.     Mental Status: She is alert and oriented to person, place, and time.  Psychiatric:        Mood and Affect: Mood normal.        Behavior: Behavior normal.      Results for orders placed or performed in visit on 08/13/23  POCT CBG (Fasting - Glucose)  Result Value Ref Range   Glucose Fasting, POC 104 (A) 70 - 99 mg/dL    Recent Results (from the past 2160 hours)  POCT CBG (Fasting - Glucose)     Status: Abnormal   Collection Time: 05/25/23  3:17 PM  Result Value Ref Range   Glucose Fasting, POC 104 (A) 70 - 99 mg/dL  [id number]     Status:  None   Collection Time: 05/25/23  3:39 PM  Result Value Ref Range   Glucose 96 70 - 99 mg/dL   BUN 12 8 - 27 mg/dL   Creatinine, Ser 1.19 0.57 - 1.00 mg/dL   eGFR 147 >82 NF/AOZ/3.08   BUN/Creatinine Ratio 18 12 -  28   Sodium 141 134 - 144 mmol/L   Potassium 4.6 3.5 - 5.2 mmol/L   Chloride 101 96 - 106 mmol/L   CO2 23 20 - 29 mmol/L   Calcium 9.3 8.7 - 10.3 mg/dL   Total Protein 7.1 6.0 - 8.5 g/dL   Albumin 4.1 3.8 - 4.9 g/dL   Globulin, Total 3.0 1.5 - 4.5 g/dL   Bilirubin Total <1.6 0.0 - 1.2 mg/dL   Alkaline Phosphatase 101 44 - 121 IU/L   AST 16 0 - 40 IU/L   ALT 15 0 - 32 IU/L  Lipid Panel w/o Chol/HDL Ratio     Status: None   Collection Time: 05/25/23  3:39 PM  Result Value Ref Range   Cholesterol, Total 151 100 - 199 mg/dL   Triglycerides 109 0 - 149 mg/dL   HDL 68 >60 mg/dL   VLDL Cholesterol Cal 19 5 - 40 mg/dL   LDL Chol Calc (NIH) 64 0 - 99 mg/dL  Hemoglobin A5W     Status: Abnormal   Collection Time: 05/25/23  3:39 PM  Result Value Ref Range   Hgb A1c MFr Bld 6.2 (H) 4.8 - 5.6 %    Comment:          Prediabetes: 5.7 - 6.4          Diabetes: >6.4          Glycemic control for adults with diabetes: <7.0    Est. average glucose Bld gHb Est-mCnc 131 mg/dL  Microalbumin, urine     Status: None   Collection Time: 05/25/23  3:48 PM  Result Value Ref Range   Microalbumin, Urine <3.0 Not Estab. ug/mL  POCT CBG (Fasting - Glucose)     Status: None   Collection Time: 08/03/23  3:22 PM  Result Value Ref Range   Glucose Fasting, POC 92 70 - 99 mg/dL  POCT CBG (Fasting - Glucose)     Status: Abnormal   Collection Time: 08/13/23  3:17 PM  Result Value Ref Range   Glucose Fasting, POC 104 (A) 70 - 99 mg/dL      Assessment & Plan:  Patient advised to continue all her medications.  Lasix to be only taken on Mondays and Thursdays.  Continue wearing compression stockings. Will check labs at follow-up. Problem List Items Addressed This Visit     Vitamin D deficiency    Type 2 diabetes mellitus with hyperglycemia, without long-term current use of insulin (HCC)   Relevant Orders   POCT CBG (Fasting - Glucose) (Completed)   Hypertension associated with diabetes (HCC) - Primary   Combined hyperlipidemia associated with type 2 diabetes mellitus (HCC)   Other Visit Diagnoses       Lower leg edema           Return in about 2 months (around 10/11/2023).   Total time spent: 25 minutes  Margaretann Loveless, MD  08/13/2023   This document may have been prepared by [facility] Voice Recognition software and as such may include unintentional dictation errors.

## 2023-08-16 ENCOUNTER — Other Ambulatory Visit: Payer: Self-pay | Admitting: Internal Medicine

## 2023-08-16 ENCOUNTER — Other Ambulatory Visit: Payer: Self-pay | Admitting: Family

## 2023-09-23 ENCOUNTER — Ambulatory Visit: Payer: BC Managed Care – PPO | Admitting: Internal Medicine

## 2023-10-11 ENCOUNTER — Encounter: Payer: Self-pay | Admitting: Internal Medicine

## 2023-10-11 ENCOUNTER — Ambulatory Visit: Payer: BC Managed Care – PPO | Admitting: Internal Medicine

## 2023-10-11 VITALS — BP 130/86 | HR 82 | Ht 61.0 in | Wt 188.4 lb

## 2023-10-11 DIAGNOSIS — E1165 Type 2 diabetes mellitus with hyperglycemia: Secondary | ICD-10-CM

## 2023-10-11 DIAGNOSIS — R600 Localized edema: Secondary | ICD-10-CM

## 2023-10-11 DIAGNOSIS — I10 Essential (primary) hypertension: Secondary | ICD-10-CM

## 2023-10-11 DIAGNOSIS — I152 Hypertension secondary to endocrine disorders: Secondary | ICD-10-CM

## 2023-10-11 DIAGNOSIS — E1159 Type 2 diabetes mellitus with other circulatory complications: Secondary | ICD-10-CM

## 2023-10-11 DIAGNOSIS — E1169 Type 2 diabetes mellitus with other specified complication: Secondary | ICD-10-CM

## 2023-10-11 DIAGNOSIS — E782 Mixed hyperlipidemia: Secondary | ICD-10-CM

## 2023-10-11 LAB — POCT CBG (FASTING - GLUCOSE)-MANUAL ENTRY: Glucose Fasting, POC: 100 mg/dL — AB (ref 70–99)

## 2023-10-11 MED ORDER — FUROSEMIDE 20 MG PO TABS: 20.0000 mg | ORAL_TABLET | Freq: Every day | ORAL | 0 refills | Status: DC

## 2023-10-11 MED ORDER — AMLODIPINE BESYLATE 5 MG PO TABS: 5.0000 mg | ORAL_TABLET | Freq: Every day | ORAL | 3 refills | Status: DC

## 2023-10-11 NOTE — Progress Notes (Signed)
 Established Patient Office Visit  Subjective:  Patient ID: Diana Bonilla, female    DOB: 07-20-1961  Age: 63 y.o. MRN: [id number]  Chief Complaint  Patient presents with   Follow-up    4 month follow up    Patient comes in for her follow-up today.  She is generally feeling well however reports of injury to her right lower leg.  She tripped and fell and developed multiple bruises on her right knee and right lower leg.  They are in various stages of healing right now.  She still has some swelling of her left lower leg.  She is advised to start taking her Lasix regularly and to wear compression stockings as tolerated.  She denies having any chest pain, no shortness of breath and no palpitations.  Patient will return fasting for blood work tomorrow.    No other concerns at this time.   Past Medical History:  Diagnosis Date   Diabetes mellitus without complication (HCC)    Hypertension     History reviewed. No pertinent surgical history.  Social History   Socioeconomic History   Marital status: Married    Spouse name: Not on file   Number of children: Not on file   Years of education: Not on file   Highest education level: Not on file  Occupational History   Not on file  Tobacco Use   Smoking status: Never   Smokeless tobacco: Never  Vaping Use   Vaping status: Never Used  Substance and Sexual Activity   Alcohol use: Not Currently   Drug use: Never   Sexual activity: Not on file  Other Topics Concern   Not on file  Social History Narrative   Not on file   Social Drivers of Health   Financial Resource Strain: Not on file  Food Insecurity: Not on file  Transportation Needs: Not on file  Physical Activity: Not on file  Stress: Not on file  Social Connections: Not on file  Intimate Partner Violence: Not on file    Family History  Problem Relation Age of Onset   Osteoporosis Mother    Hypertension Father    Breast cancer Neg Hx     No Known  Allergies  Outpatient Medications Prior to Visit  Medication Sig   losartan-hydrochlorothiazide (HYZAAR) 100-12.5 MG tablet TAKE 1 TABLET BY MOUTH DAILY   metFORMIN (GLUCOPHAGE-XR) 500 MG 24 hr tablet TAKE 1 TABLET BY MOUTH TWICE A DAY   sertraline (ZOLOFT) 50 MG tablet TAKE 1 TABLET BY MOUTH DAILY   simvastatin (ZOCOR) 20 MG tablet TAKE 1 TABLET BY MOUTH DAILY   verapamil (CALAN-SR) 240 MG CR tablet TAKE 1 TABLET BY MOUTH DAILY   baclofen (LIORESAL) 10 MG tablet Take 1 tablet (10 mg total) by mouth daily. (Patient not taking: Reported on 08/03/2023)   Cholecalciferol (VITAMIN D3) 1.25 MG (50000 UT) CAPS TAKE 1 CAPSULE BY MOUTH ONCE WEEKLY. (Patient not taking: Reported on 10/11/2023)   meloxicam (MOBIC) 15 MG tablet Take 1 tablet (15 mg total) by mouth daily as needed. (Patient not taking: Reported on 10/11/2023)   [DISCONTINUED] amLODipine (NORVASC) 5 MG tablet TAKE 1 TABLET BY MOUTH DAILY (Patient not taking: Reported on 10/11/2023)   [DISCONTINUED] furosemide (LASIX) 20 MG tablet Take 1 tablet (20 mg total) by mouth daily. (Patient not taking: Reported on 10/11/2023)   No facility-administered medications prior to visit.    Review of Systems  Constitutional: Negative.  Negative for chills, fever, malaise/fatigue and weight loss.  HENT: Negative.  Negative for sore throat.   Eyes: Negative.   Respiratory: Negative.  Negative for cough, shortness of breath and stridor.   Cardiovascular: Negative.  Negative for chest pain, palpitations and leg swelling.  Gastrointestinal: Negative.  Negative for abdominal pain, blood in stool, constipation, diarrhea, heartburn, melena, nausea and vomiting.  Genitourinary: Negative.  Negative for dysuria and flank pain.  Musculoskeletal: Negative.  Negative for joint pain and myalgias.  Neurological: Negative.  Negative for dizziness and headaches.  Endo/Heme/Allergies: Negative.   Psychiatric/Behavioral: Negative.  Negative for depression and suicidal ideas.  The patient is not nervous/anxious.        Objective:   BP 130/86   Pulse 82   Ht 5\' 1"  (1.549 m)   Wt 188 lb 6.4 oz (85.5 kg)   SpO2 98%   BMI 35.60 kg/m   Vitals:   10/11/23 1513  BP: 130/86  Pulse: 82  Height: 5\' 1"  (1.549 m)  Weight: 188 lb 6.4 oz (85.5 kg)  SpO2: 98%  BMI (Calculated): 35.62    Physical Exam Vitals and nursing note reviewed.  Constitutional:      Appearance: Normal appearance.  HENT:     Head: Normocephalic and atraumatic.     Nose: Nose normal.     Mouth/Throat:     Mouth: Mucous membranes are moist.     Pharynx: Oropharynx is clear.  Eyes:     Conjunctiva/sclera: Conjunctivae normal.     Pupils: Pupils are equal, round, and reactive to light.  Cardiovascular:     Rate and Rhythm: Normal rate and regular rhythm.     Pulses: Normal pulses.     Heart sounds: Normal heart sounds. No murmur heard. Pulmonary:     Effort: Pulmonary effort is normal.     Breath sounds: Normal breath sounds. No wheezing.  Abdominal:     General: Bowel sounds are normal.     Palpations: Abdomen is soft.     Tenderness: There is no abdominal tenderness. There is no right CVA tenderness or left CVA tenderness.  Musculoskeletal:        General: Normal range of motion.     Cervical back: Normal range of motion.     Right lower leg: Edema present.     Left lower leg: No edema.  Skin:    General: Skin is warm and dry.  Neurological:     General: No focal deficit present.     Mental Status: She is alert and oriented to person, place, and time.  Psychiatric:        Mood and Affect: Mood normal.        Behavior: Behavior normal.      Results for orders placed or performed in visit on 10/11/23  POCT CBG (Fasting - Glucose)  Result Value Ref Range   Glucose Fasting, POC 100 (A) 70 - 99 mg/dL    Recent Results (from the past 2160 hours)  POCT CBG (Fasting - Glucose)     Status: None   Collection Time: 08/03/23  3:22 PM  Result Value Ref Range   Glucose  Fasting, POC 92 70 - 99 mg/dL  POCT CBG (Fasting - Glucose)     Status: Abnormal   Collection Time: 08/13/23  3:17 PM  Result Value Ref Range   Glucose Fasting, POC 104 (A) 70 - 99 mg/dL  POCT CBG (Fasting - Glucose)     Status: Abnormal   Collection Time: 10/11/23  3:18 PM  Result Value Ref  Range   Glucose Fasting, POC 100 (A) 70 - 99 mg/dL      Assessment & Plan:  Patient advised to continue taking medications regularly.  Also needs to take Lasix every day for the next 2 weeks.  Follow-up is in 2 weeks.  Patient will return fasting for blood work. Problem List Items Addressed This Visit     Type 2 diabetes mellitus with hyperglycemia, without long-term current use of insulin (HCC)   Relevant Orders   POCT CBG (Fasting - Glucose) (Completed)   Hypertension associated with diabetes (HCC) - Primary   Relevant Medications   amLODipine (NORVASC) 5 MG tablet   furosemide (LASIX) 20 MG tablet   Combined hyperlipidemia associated with type 2 diabetes mellitus (HCC)   Relevant Medications   amLODipine (NORVASC) 5 MG tablet   furosemide (LASIX) 20 MG tablet   Other Visit Diagnoses       Essential hypertension, benign       Relevant Medications   amLODipine (NORVASC) 5 MG tablet   furosemide (LASIX) 20 MG tablet     Lower leg edema       Relevant Medications   furosemide (LASIX) 20 MG tablet       Return in about 2 weeks (around 10/25/2023).   Total time spent: 30 minutes  Margaretann Loveless, MD  10/11/2023   This document may have been prepared by [facility] Voice Recognition software and as such may include unintentional dictation errors.

## 2023-10-12 ENCOUNTER — Other Ambulatory Visit

## 2023-10-13 LAB — CMP14+EGFR
ALT: 34 IU/L — ABNORMAL HIGH (ref 0–32)
AST: 25 IU/L (ref 0–40)
Albumin: 4.2 g/dL (ref 3.8–4.9)
Alkaline Phosphatase: 102 IU/L (ref 44–121)
BUN/Creatinine Ratio: 16 (ref 12–28)
BUN: 12 mg/dL (ref 8–27)
Bilirubin Total: 0.2 mg/dL (ref 0.0–1.2)
CO2: 24 mmol/L (ref 20–29)
Calcium: 9.2 mg/dL (ref 8.7–10.3)
Chloride: 102 mmol/L (ref 96–106)
Creatinine, Ser: 0.77 mg/dL (ref 0.57–1.00)
Globulin, Total: 2.9 g/dL (ref 1.5–4.5)
Glucose: 94 mg/dL (ref 70–99)
Potassium: 3.7 mmol/L (ref 3.5–5.2)
Sodium: 140 mmol/L (ref 134–144)
Total Protein: 7.1 g/dL (ref 6.0–8.5)
eGFR: 88 mL/min/{1.73_m2} (ref >59)

## 2023-10-13 LAB — HEMOGLOBIN A1C
Est. average glucose Bld gHb Est-mCnc: 126 mg/dL
Hgb A1c MFr Bld: 6 % — ABNORMAL HIGH (ref 4.8–5.6)

## 2023-10-13 LAB — CBC WITH DIFFERENTIAL/PLATELET
Basophils Absolute: 0.1 10*3/uL (ref 0.0–0.2)
Basos: 1 %
EOS (ABSOLUTE): 0.2 10*3/uL (ref 0.0–0.4)
Eos: 2 %
Hematocrit: 37.7 % (ref 34.0–46.6)
Hemoglobin: 12.5 g/dL (ref 11.1–15.9)
Immature Grans (Abs): 0.1 10*3/uL (ref 0.0–0.1)
Immature Granulocytes: 1 %
Lymphocytes Absolute: 2.3 10*3/uL (ref 0.7–3.1)
Lymphs: 21 %
MCH: 28.2 pg (ref 26.6–33.0)
MCHC: 33.2 g/dL (ref 31.5–35.7)
MCV: 85 fL (ref 79–97)
Monocytes Absolute: 0.8 10*3/uL (ref 0.1–0.9)
Monocytes: 7 %
Neutrophils Absolute: 7.5 10*3/uL — ABNORMAL HIGH (ref 1.4–7.0)
Neutrophils: 68 %
Platelets: 371 10*3/uL (ref 150–450)
RBC: 4.43 x10E6/uL (ref 3.77–5.28)
RDW: 14.4 % (ref 11.7–15.4)
WBC: 11 10*3/uL — ABNORMAL HIGH (ref 3.4–10.8)

## 2023-10-13 LAB — LIPID PANEL W/O CHOL/HDL RATIO
Cholesterol, Total: 175 mg/dL (ref 100–199)
HDL: 55 mg/dL (ref >39)
LDL Chol Calc (NIH): 99 mg/dL (ref 0–99)
Triglycerides: 118 mg/dL (ref 0–149)
VLDL Cholesterol Cal: 21 mg/dL (ref 5–40)

## 2023-10-25 ENCOUNTER — Encounter: Payer: Self-pay | Admitting: Internal Medicine

## 2023-10-25 ENCOUNTER — Ambulatory Visit: Admitting: Internal Medicine

## 2023-10-25 VITALS — BP 138/90 | HR 75 | Ht 61.0 in | Wt 188.4 lb

## 2023-10-25 DIAGNOSIS — E782 Mixed hyperlipidemia: Secondary | ICD-10-CM

## 2023-10-25 DIAGNOSIS — R600 Localized edema: Secondary | ICD-10-CM

## 2023-10-25 DIAGNOSIS — E1159 Type 2 diabetes mellitus with other circulatory complications: Secondary | ICD-10-CM

## 2023-10-25 DIAGNOSIS — M79604 Pain in right leg: Secondary | ICD-10-CM

## 2023-10-25 DIAGNOSIS — E1169 Type 2 diabetes mellitus with other specified complication: Secondary | ICD-10-CM

## 2023-10-25 DIAGNOSIS — L03115 Cellulitis of right lower limb: Secondary | ICD-10-CM

## 2023-10-25 DIAGNOSIS — M79605 Pain in left leg: Secondary | ICD-10-CM

## 2023-10-25 DIAGNOSIS — E1165 Type 2 diabetes mellitus with hyperglycemia: Secondary | ICD-10-CM | POA: Diagnosis not present

## 2023-10-25 DIAGNOSIS — I152 Hypertension secondary to endocrine disorders: Secondary | ICD-10-CM

## 2023-10-25 MED ORDER — CEPHALEXIN 500 MG PO CAPS: 500.0000 mg | ORAL_CAPSULE | Freq: Three times a day (TID) | ORAL | 0 refills | Status: DC

## 2023-10-25 MED ORDER — FUROSEMIDE 20 MG PO TABS: 20.0000 mg | ORAL_TABLET | Freq: Every day | ORAL | 0 refills | Status: DC

## 2023-10-25 NOTE — Progress Notes (Signed)
 Established Patient Office Visit  Subjective:  Patient ID: Diana Bonilla, female    DOB: [birth year]  Age: 63 y.o. MRN: [id number]  Chief Complaint  Patient presents with   Follow-up    2 week follow up    Patient is here for her follow-up accompanied by her daughter.  She has been taking Lasix tablet 20 mg/day.  Patient reports that she still has swelling of her lower legs, right being more than the left, and with increased redness and some discomfort.  She is currently on Norvasc 5 mg, will stop that but continue other medications.  Will schedule venous Dopplers of both her legs. Also add Keflex tablet for mild cellulitis. Patient has been advised to wear compression stockings at work because she has to stand for extended periods of time. Denies chest pain, no shortness of breath, no palpitations.    No other concerns at this time.   Past Medical History:  Diagnosis Date   Diabetes mellitus without complication (HCC)    Hypertension     History reviewed. No pertinent surgical history.  Social History   Socioeconomic History   Marital status: Married    Spouse name: Not on file   Number of children: Not on file   Years of education: Not on file   Highest education level: Not on file  Occupational History   Not on file  Tobacco Use   Smoking status: Never   Smokeless tobacco: Never  Vaping Use   Vaping status: Never Used  Substance and Sexual Activity   Alcohol use: Not Currently   Drug use: Never   Sexual activity: Not on file  Other Topics Concern   Not on file  Social History Narrative   Not on file   Social Drivers of Health   Financial Resource Strain: Not on file  Food Insecurity: Not on file  Transportation Needs: Not on file  Physical Activity: Not on file  Stress: Not on file  Social Connections: Not on file  Intimate Partner Violence: Not on file    Family History  Problem Relation Age of Onset   Osteoporosis Mother    Hypertension Father     Breast cancer Neg Hx     No Known Allergies  Outpatient Medications Prior to Visit  Medication Sig   Cholecalciferol (VITAMIN D3) 1.25 MG (50000 UT) CAPS TAKE 1 CAPSULE BY MOUTH ONCE WEEKLY.   losartan-hydrochlorothiazide (HYZAAR) 100-12.5 MG tablet TAKE 1 TABLET BY MOUTH DAILY   metFORMIN (GLUCOPHAGE-XR) 500 MG 24 hr tablet TAKE 1 TABLET BY MOUTH TWICE A DAY   sertraline (ZOLOFT) 50 MG tablet TAKE 1 TABLET BY MOUTH DAILY   simvastatin (ZOCOR) 20 MG tablet TAKE 1 TABLET BY MOUTH DAILY   verapamil (CALAN-SR) 240 MG CR tablet TAKE 1 TABLET BY MOUTH DAILY   [DISCONTINUED] amLODipine (NORVASC) 5 MG tablet Take 1 tablet (5 mg total) by mouth daily.   [DISCONTINUED] furosemide (LASIX) 20 MG tablet Take 1 tablet (20 mg total) by mouth daily.   meloxicam (MOBIC) 15 MG tablet Take 1 tablet (15 mg total) by mouth daily as needed. (Patient not taking: Reported on 05/25/2023)   No facility-administered medications prior to visit.    Review of Systems  Constitutional: Negative.  Negative for chills, fever, malaise/fatigue and weight loss.  HENT: Negative.  Negative for sore throat.   Eyes: Negative.   Respiratory: Negative.  Negative for cough and shortness of breath.   Cardiovascular: Negative.  Negative for chest pain, palpitations  and leg swelling.  Gastrointestinal: Negative.  Negative for abdominal pain, constipation, diarrhea, heartburn, nausea and vomiting.  Genitourinary: Negative.  Negative for dysuria and flank pain.  Musculoskeletal: Negative.  Negative for joint pain and myalgias.  Skin: Negative.   Neurological: Negative.  Negative for dizziness and headaches.  Endo/Heme/Allergies: Negative.   Psychiatric/Behavioral: Negative.  Negative for depression and suicidal ideas. The patient is not nervous/anxious.        Objective:   BP (!) 138/90   Pulse 75   Ht 5\' 1"  (1.549 m)   Wt 188 lb 6.4 oz (85.5 kg)   SpO2 99%   BMI 35.60 kg/m   Vitals:   10/25/23 1516  BP: (!)  138/90  Pulse: 75  Height: 5\' 1"  (1.549 m)  Weight: 188 lb 6.4 oz (85.5 kg)  SpO2: 99%  BMI (Calculated): 35.62    Physical Exam Vitals and nursing note reviewed.  Constitutional:      Appearance: Normal appearance.  HENT:     Head: Normocephalic and atraumatic.     Nose: Nose normal.     Mouth/Throat:     Mouth: Mucous membranes are moist.     Pharynx: Oropharynx is clear.  Eyes:     Conjunctiva/sclera: Conjunctivae normal.     Pupils: Pupils are equal, round, and reactive to light.  Cardiovascular:     Rate and Rhythm: Normal rate and regular rhythm.     Pulses: Normal pulses.     Heart sounds: Normal heart sounds. No murmur heard. Pulmonary:     Effort: Pulmonary effort is normal.     Breath sounds: Normal breath sounds. No wheezing.  Abdominal:     General: Bowel sounds are normal.     Palpations: Abdomen is soft.     Tenderness: There is no abdominal tenderness. There is no right CVA tenderness or left CVA tenderness.  Musculoskeletal:        General: Normal range of motion.     Cervical back: Normal range of motion.     Right lower leg: Edema present.     Left lower leg: Edema present.  Skin:    General: Skin is warm and dry.  Neurological:     General: No focal deficit present.     Mental Status: She is alert and oriented to person, place, and time.  Psychiatric:        Mood and Affect: Mood normal.        Behavior: Behavior normal.      No results found for any visits on 10/25/23.  Recent Results (from the past 2160 hours)  POCT CBG (Fasting - Glucose)     Status: None   Collection Time: 08/03/23  3:22 PM  Result Value Ref Range   Glucose Fasting, POC 92 70 - 99 mg/dL  POCT CBG (Fasting - Glucose)     Status: Abnormal   Collection Time: 08/13/23  3:17 PM  Result Value Ref Range   Glucose Fasting, POC 104 (A) 70 - 99 mg/dL  POCT CBG (Fasting - Glucose)     Status: Abnormal   Collection Time: 10/11/23  3:18 PM  Result Value Ref Range   Glucose  Fasting, POC 100 (A) 70 - 99 mg/dL  CBC with Diff     Status: Abnormal   Collection Time: 10/12/23  3:24 PM  Result Value Ref Range   WBC 11.0 (H) 3.4 - 10.8 x10E3/uL   RBC 4.43 3.77 - 5.28 x10E6/uL   Hemoglobin 12.5 11.1 -  15.9 g/dL   Hematocrit 19.1 47.8 - 46.6 %   MCV 85 79 - 97 fL   MCH 28.2 26.6 - 33.0 pg   MCHC 33.2 31.5 - 35.7 g/dL   RDW 29.5 62.1 - 30.8 %   Platelets 371 150 - 450 x10E3/uL   Neutrophils 68 Not Estab. %   Lymphs 21 Not Estab. %   Monocytes 7 Not Estab. %   Eos 2 Not Estab. %   Basos 1 Not Estab. %   Neutrophils Absolute 7.5 (H) 1.4 - 7.0 x10E3/uL   Lymphocytes Absolute 2.3 0.7 - 3.1 x10E3/uL   Monocytes Absolute 0.8 0.1 - 0.9 x10E3/uL   EOS (ABSOLUTE) 0.2 0.0 - 0.4 x10E3/uL   Basophils Absolute 0.1 0.0 - 0.2 x10E3/uL   Immature Granulocytes 1 Not Estab. %   Immature Grans (Abs) 0.1 0.0 - 0.1 x10E3/uL  CMP14+EGFR     Status: Abnormal   Collection Time: 10/12/23  3:24 PM  Result Value Ref Range   Glucose 94 70 - 99 mg/dL   BUN 12 8 - 27 mg/dL   Creatinine, Ser 6.57 0.57 - 1.00 mg/dL   eGFR 88 >84 ON/GEX/5.28   BUN/Creatinine Ratio 16 12 - 28   Sodium 140 134 - 144 mmol/L   Potassium 3.7 3.5 - 5.2 mmol/L   Chloride 102 96 - 106 mmol/L   CO2 24 20 - 29 mmol/L   Calcium 9.2 8.7 - 10.3 mg/dL   Total Protein 7.1 6.0 - 8.5 g/dL   Albumin 4.2 3.8 - 4.9 g/dL   Globulin, Total 2.9 1.5 - 4.5 g/dL   Bilirubin Total 0.2 0.0 - 1.2 mg/dL   Alkaline Phosphatase 102 44 - 121 IU/L   AST 25 0 - 40 IU/L   ALT 34 (H) 0 - 32 IU/L  Lipid Panel w/o Chol/HDL Ratio     Status: None   Collection Time: 10/12/23  3:24 PM  Result Value Ref Range   Cholesterol, Total 175 100 - 199 mg/dL   Triglycerides 413 0 - 149 mg/dL   HDL 55 >24 mg/dL   VLDL Cholesterol Cal 21 5 - 40 mg/dL   LDL Chol Calc (NIH) 99 0 - 99 mg/dL  Hemoglobin M0N     Status: Abnormal   Collection Time: 10/12/23  3:24 PM  Result Value Ref Range   Hgb A1c MFr Bld 6.0 (H) 4.8 - 5.6 %    Comment:           Prediabetes: 5.7 - 6.4          Diabetes: >6.4          Glycemic control for adults with diabetes: <7.0    Est. average glucose Bld gHb Est-mCnc 126 mg/dL      Assessment & Plan:  Stop Norvasc.  Monitor blood pressure.  Continue Lasix.  Add Keflex.  Check venous Dopplers.  Patient advised again to wear compression stockings. Problem List Items Addressed This Visit     Type 2 diabetes mellitus with hyperglycemia, without long-term current use of insulin (HCC)   Hypertension associated with diabetes (HCC)   Relevant Medications   furosemide (LASIX) 20 MG tablet   Combined hyperlipidemia associated with type 2 diabetes mellitus (HCC)   Relevant Medications   furosemide (LASIX) 20 MG tablet   Other Visit Diagnoses       Pain in both lower extremities    -  Primary   Relevant Medications   cephALEXin (KEFLEX) 500 MG capsule  Other Relevant Orders   VAS [place] LOWER EXTREMITY VENOUS (DVT)     Lower leg edema       Relevant Medications   furosemide (LASIX) 20 MG tablet     Cellulitis of right lower extremity       Relevant Medications   cephALEXin (KEFLEX) 500 MG capsule       Return in about 10 days (around 11/04/2023).   Total time spent: 30 minutes  Margaretann Loveless, MD  10/25/2023   This document may have been prepared by [facility] Voice Recognition software and as such may include unintentional dictation errors.

## 2023-10-29 ENCOUNTER — Telehealth: Payer: Self-pay | Admitting: Internal Medicine

## 2023-10-29 NOTE — Telephone Encounter (Signed)
 Patient's daughter left VM stating that Dr. Welton Flakes was supposed to be taking her off one of her BP meds and replacing it with another one. States that the new BP med was not sent to the pharmacy and they would like to know if she is going to send one? Please advise.

## 2023-11-04 ENCOUNTER — Ambulatory Visit: Admitting: Internal Medicine

## 2023-11-04 ENCOUNTER — Encounter: Payer: Self-pay | Admitting: Internal Medicine

## 2023-11-04 VITALS — BP 132/72 | HR 111 | Ht 61.0 in | Wt 185.4 lb

## 2023-11-04 DIAGNOSIS — E1159 Type 2 diabetes mellitus with other circulatory complications: Secondary | ICD-10-CM | POA: Diagnosis not present

## 2023-11-04 DIAGNOSIS — I152 Hypertension secondary to endocrine disorders: Secondary | ICD-10-CM

## 2023-11-04 DIAGNOSIS — R600 Localized edema: Secondary | ICD-10-CM | POA: Diagnosis not present

## 2023-11-04 DIAGNOSIS — E782 Mixed hyperlipidemia: Secondary | ICD-10-CM

## 2023-11-04 DIAGNOSIS — E1165 Type 2 diabetes mellitus with hyperglycemia: Secondary | ICD-10-CM

## 2023-11-04 DIAGNOSIS — E1169 Type 2 diabetes mellitus with other specified complication: Secondary | ICD-10-CM

## 2023-11-04 DIAGNOSIS — L03115 Cellulitis of right lower limb: Secondary | ICD-10-CM

## 2023-11-04 MED ORDER — LOSARTAN POTASSIUM-HCTZ 100-25 MG PO TABS: 1.0000 | ORAL_TABLET | Freq: Every day | ORAL | 11 refills | Status: AC

## 2023-11-04 NOTE — Progress Notes (Signed)
 Established Patient Office Visit  Subjective:  Patient ID: Diana Bonilla, female    DOB: 1960-08-24  Age: 63 y.o. MRN: [id number]  Chief Complaint  Patient presents with   Follow-up    10 day follow up    Patient comes in for follow-up of her leg swelling, accompanied by her daughter.  Her Norvasc 5 mg was stopped at her last visit, Keflex was added for cellulitis and advised to wear compression stockings and to continue her Lasix.  Today she is wearing compression stockings, and her legs look completely normal.  There is no more swelling, no redness and no tenderness.  Patient now advised to cut back her Lasix to Monday Wednesdays and Fridays and then to twice a week until stopped.  She can stay off the Norvasc for now. Will change her losartan/HCTZ 100/25 mg, new prescription sent. Her venous Dopplers will be canceled. She needs to wear her compression stockings every day.    No other concerns at this time.   Past Medical History:  Diagnosis Date   Diabetes mellitus without complication (HCC)    Hypertension     History reviewed. No pertinent surgical history.  Social History   Socioeconomic History   Marital status: Married    Spouse name: Not on file   Number of children: Not on file   Years of education: Not on file   Highest education level: Not on file  Occupational History   Not on file  Tobacco Use   Smoking status: Never   Smokeless tobacco: Never  Vaping Use   Vaping status: Never Used  Substance and Sexual Activity   Alcohol use: Not Currently   Drug use: Never   Sexual activity: Not on file  Other Topics Concern   Not on file  Social History Narrative   Not on file   Social Drivers of Health   Financial Resource Strain: Not on file  Food Insecurity: Not on file  Transportation Needs: Not on file  Physical Activity: Not on file  Stress: Not on file  Social Connections: Not on file  Intimate Partner Violence: Not on file    Family History   Problem Relation Age of Onset   Osteoporosis Mother    Hypertension Father    Breast cancer Neg Hx     No Known Allergies  Outpatient Medications Prior to Visit  Medication Sig   Cholecalciferol (VITAMIN D3) 1.25 MG (50000 UT) CAPS TAKE 1 CAPSULE BY MOUTH ONCE WEEKLY.   furosemide (LASIX) 20 MG tablet Take 1 tablet (20 mg total) by mouth daily.   meloxicam (MOBIC) 15 MG tablet Take 1 tablet (15 mg total) by mouth daily as needed.   metFORMIN (GLUCOPHAGE-XR) 500 MG 24 hr tablet TAKE 1 TABLET BY MOUTH TWICE A DAY   sertraline (ZOLOFT) 50 MG tablet TAKE 1 TABLET BY MOUTH DAILY   simvastatin (ZOCOR) 20 MG tablet TAKE 1 TABLET BY MOUTH DAILY   verapamil (CALAN-SR) 240 MG CR tablet TAKE 1 TABLET BY MOUTH DAILY   [DISCONTINUED] losartan-hydrochlorothiazide (HYZAAR) 100-12.5 MG tablet TAKE 1 TABLET BY MOUTH DAILY   cephALEXin (KEFLEX) 500 MG capsule Take 1 capsule (500 mg total) by mouth 3 (three) times daily. (Patient not taking: Reported on 11/04/2023)   No facility-administered medications prior to visit.    Review of Systems  Constitutional: Negative.  Negative for chills, fever, malaise/fatigue and weight loss.  HENT: Negative.  Negative for sinus pain and sore throat.   Eyes: Negative.  Respiratory: Negative.  Negative for cough and shortness of breath.   Cardiovascular: Negative.  Negative for chest pain, palpitations and leg swelling.  Gastrointestinal: Negative.  Negative for abdominal pain, constipation, diarrhea, heartburn, nausea and vomiting.  Genitourinary: Negative.  Negative for dysuria and flank pain.  Musculoskeletal: Negative.  Negative for joint pain and myalgias.  Skin: Negative.   Neurological: Negative.  Negative for dizziness, tingling, tremors and headaches.  Endo/Heme/Allergies: Negative.   Psychiatric/Behavioral: Negative.  Negative for depression and suicidal ideas. The patient is not nervous/anxious.        Objective:   BP 132/72   Pulse (!) 111   Ht  5\' 1"  (1.549 m)   Wt 185 lb 6.4 oz (84.1 kg)   SpO2 99%   BMI 35.03 kg/m   Vitals:   11/04/23 1513  BP: 132/72  Pulse: (!) 111  Height: 5\' 1"  (1.549 m)  Weight: 185 lb 6.4 oz (84.1 kg)  SpO2: 99%  BMI (Calculated): 35.05    Physical Exam Vitals and nursing note reviewed.  Constitutional:      Appearance: Normal appearance.  HENT:     Head: Normocephalic and atraumatic.     Nose: Nose normal.     Mouth/Throat:     Mouth: Mucous membranes are moist.     Pharynx: Oropharynx is clear.  Eyes:     Conjunctiva/sclera: Conjunctivae normal.     Pupils: Pupils are equal, round, and reactive to light.  Cardiovascular:     Rate and Rhythm: Normal rate and regular rhythm.     Pulses: Normal pulses.     Heart sounds: Normal heart sounds. No murmur heard. Pulmonary:     Effort: Pulmonary effort is normal.     Breath sounds: Normal breath sounds. No wheezing.  Abdominal:     General: Bowel sounds are normal.     Palpations: Abdomen is soft.     Tenderness: There is no abdominal tenderness. There is no right CVA tenderness or left CVA tenderness.  Musculoskeletal:        General: Normal range of motion.     Cervical back: Normal range of motion.     Right lower leg: No edema.     Left lower leg: No edema.  Skin:    General: Skin is warm and dry.  Neurological:     General: No focal deficit present.     Mental Status: She is alert and oriented to person, place, and time.  Psychiatric:        Mood and Affect: Mood normal.        Behavior: Behavior normal.      No results found for any visits on 11/04/23.  Recent Results (from the past 2160 hours)  POCT CBG (Fasting - Glucose)     Status: Abnormal   Collection Time: 08/13/23  3:17 PM  Result Value Ref Range   Glucose Fasting, POC 104 (A) 70 - 99 mg/dL  POCT CBG (Fasting - Glucose)     Status: Abnormal   Collection Time: 10/11/23  3:18 PM  Result Value Ref Range   Glucose Fasting, POC 100 (A) 70 - 99 mg/dL  CBC with  Diff     Status: Abnormal   Collection Time: 10/12/23  3:24 PM  Result Value Ref Range   WBC 11.0 (H) 3.4 - 10.8 x10E3/uL   RBC 4.43 3.77 - 5.28 x10E6/uL   Hemoglobin 12.5 11.1 - 15.9 g/dL   Hematocrit 82.9 56.2 - 46.6 %   MCV  85 79 - 97 fL   MCH 28.2 26.6 - 33.0 pg   MCHC 33.2 31.5 - 35.7 g/dL   RDW 54.0 98.1 - 19.1 %   Platelets 371 150 - 450 x10E3/uL   Neutrophils 68 Not Estab. %   Lymphs 21 Not Estab. %   Monocytes 7 Not Estab. %   Eos 2 Not Estab. %   Basos 1 Not Estab. %   Neutrophils Absolute 7.5 (H) 1.4 - 7.0 x10E3/uL   Lymphocytes Absolute 2.3 0.7 - 3.1 x10E3/uL   Monocytes Absolute 0.8 0.1 - 0.9 x10E3/uL   EOS (ABSOLUTE) 0.2 0.0 - 0.4 x10E3/uL   Basophils Absolute 0.1 0.0 - 0.2 x10E3/uL   Immature Granulocytes 1 Not Estab. %   Immature Grans (Abs) 0.1 0.0 - 0.1 x10E3/uL  CMP14+EGFR     Status: Abnormal   Collection Time: 10/12/23  3:24 PM  Result Value Ref Range   Glucose 94 70 - 99 mg/dL   BUN 12 8 - 27 mg/dL   Creatinine, Ser 4.78 0.57 - 1.00 mg/dL   eGFR 88 >29 FA/OZH/0.86   BUN/Creatinine Ratio 16 12 - 28   Sodium 140 134 - 144 mmol/L   Potassium 3.7 3.5 - 5.2 mmol/L   Chloride 102 96 - 106 mmol/L   CO2 24 20 - 29 mmol/L   Calcium 9.2 8.7 - 10.3 mg/dL   Total Protein 7.1 6.0 - 8.5 g/dL   Albumin 4.2 3.8 - 4.9 g/dL   Globulin, Total 2.9 1.5 - 4.5 g/dL   Bilirubin Total 0.2 0.0 - 1.2 mg/dL   Alkaline Phosphatase 102 44 - 121 IU/L   AST 25 0 - 40 IU/L   ALT 34 (H) 0 - 32 IU/L  Lipid Panel w/o Chol/HDL Ratio     Status: None   Collection Time: 10/12/23  3:24 PM  Result Value Ref Range   Cholesterol, Total 175 100 - 199 mg/dL   Triglycerides 578 0 - 149 mg/dL   HDL 55 >46 mg/dL   VLDL Cholesterol Cal 21 5 - 40 mg/dL   LDL Chol Calc (NIH) 99 0 - 99 mg/dL  Hemoglobin N6E     Status: Abnormal   Collection Time: 10/12/23  3:24 PM  Result Value Ref Range   Hgb A1c MFr Bld 6.0 (H) 4.8 - 5.6 %    Comment:          Prediabetes: 5.7 - 6.4          Diabetes:  >6.4          Glycemic control for adults with diabetes: <7.0    Est. average glucose Bld gHb Est-mCnc 126 mg/dL      Assessment & Plan:  Patient to continue medications as discussed.  Wear her compression stockings. Will check her potassium level at follow-up.  Monitor blood pressure at home. Problem List Items Addressed This Visit     Type 2 diabetes mellitus with hyperglycemia, without long-term current use of insulin (HCC)   Relevant Medications   losartan-hydrochlorothiazide (HYZAAR) 100-25 MG tablet   Hypertension associated with diabetes (HCC) - Primary   Relevant Medications   losartan-hydrochlorothiazide (HYZAAR) 100-25 MG tablet   Combined hyperlipidemia associated with type 2 diabetes mellitus (HCC)   Relevant Medications   losartan-hydrochlorothiazide (HYZAAR) 100-25 MG tablet   Lower leg edema   Other Visit Diagnoses       Cellulitis of right lower extremity           Return in about 3  weeks (around 11/25/2023).   Total time spent: 30 minutes  Aisha Hove, MD  11/04/2023   This document may have been prepared by [facility] Voice Recognition software and as such may include unintentional dictation errors.

## 2023-11-17 ENCOUNTER — Telehealth: Payer: Self-pay | Admitting: Internal Medicine

## 2023-11-17 NOTE — Telephone Encounter (Signed)
 Diana Bonilla left VM to see if patient is still supposed to be taking losartan . Per Dr. Cocke Cid last note patient should continue losartan /HCTZ 100/25 mg.

## 2023-12-02 ENCOUNTER — Ambulatory Visit: Admitting: Internal Medicine

## 2023-12-02 ENCOUNTER — Encounter: Payer: Self-pay | Admitting: Internal Medicine

## 2023-12-02 VITALS — BP 122/78 | HR 67 | Ht 61.0 in | Wt 183.4 lb

## 2023-12-02 DIAGNOSIS — R600 Localized edema: Secondary | ICD-10-CM | POA: Diagnosis not present

## 2023-12-02 DIAGNOSIS — E1165 Type 2 diabetes mellitus with hyperglycemia: Secondary | ICD-10-CM

## 2023-12-02 DIAGNOSIS — E782 Mixed hyperlipidemia: Secondary | ICD-10-CM | POA: Diagnosis not present

## 2023-12-02 DIAGNOSIS — E1159 Type 2 diabetes mellitus with other circulatory complications: Secondary | ICD-10-CM

## 2023-12-02 DIAGNOSIS — I152 Hypertension secondary to endocrine disorders: Secondary | ICD-10-CM

## 2023-12-02 DIAGNOSIS — E1169 Type 2 diabetes mellitus with other specified complication: Secondary | ICD-10-CM

## 2023-12-02 MED ORDER — FUROSEMIDE 20 MG PO TABS: 20.0000 mg | ORAL_TABLET | Freq: Every day | ORAL | 0 refills | Status: AC

## 2023-12-02 NOTE — Progress Notes (Signed)
 Established Patient Office Visit  Subjective:  Patient ID: Diana Bonilla, female    DOB: November 03, 1960  Age: 63 y.o. MRN: [id number]  Chief Complaint  Patient presents with   Follow-up    3 week follow up    Patient comes in for follow-up today.  Her blood pressure is under good control and also has no further leg edema.  She is in good spirits and denies any chest pain or shortness of breath, no nausea vomiting and no headaches or dizziness.  She is advised to reduce her Lasix  to 1 tablet twice a day for 2 weeks and then stop completely.  She can keep the rest for as needed use only.  She is currently on Hyzaar 100/25 mg/day. Will check her potassium level today.    No other concerns at this time.   Past Medical History:  Diagnosis Date   Diabetes mellitus without complication (HCC)    Hypertension     History reviewed. No pertinent surgical history.  Social History   Socioeconomic History   Marital status: Married    Spouse name: Not on file   Number of children: Not on file   Years of education: Not on file   Highest education level: Not on file  Occupational History   Not on file  Tobacco Use   Smoking status: Never   Smokeless tobacco: Never  Vaping Use   Vaping status: Never Used  Substance and Sexual Activity   Alcohol use: Not Currently   Drug use: Never   Sexual activity: Not on file  Other Topics Concern   Not on file  Social History Narrative   Not on file   Social Drivers of Health   Financial Resource Strain: Not on file  Food Insecurity: Not on file  Transportation Needs: Not on file  Physical Activity: Not on file  Stress: Not on file  Social Connections: Not on file  Intimate Partner Violence: Not on file    Family History  Problem Relation Age of Onset   Osteoporosis Mother    Hypertension Father    Breast cancer Neg Hx     No Known Allergies  Outpatient Medications Prior to Visit  Medication Sig   Cholecalciferol (VITAMIN D3)  1.25 MG (50000 UT) CAPS TAKE 1 CAPSULE BY MOUTH ONCE WEEKLY.   losartan -hydrochlorothiazide (HYZAAR) 100-25 MG tablet Take 1 tablet by mouth daily.   metFORMIN (GLUCOPHAGE-XR) 500 MG 24 hr tablet TAKE 1 TABLET BY MOUTH TWICE A DAY   sertraline (ZOLOFT) 50 MG tablet TAKE 1 TABLET BY MOUTH DAILY   simvastatin (ZOCOR) 20 MG tablet TAKE 1 TABLET BY MOUTH DAILY   verapamil  (CALAN -SR) 240 MG CR tablet TAKE 1 TABLET BY MOUTH DAILY   [DISCONTINUED] furosemide  (LASIX ) 20 MG tablet Take 1 tablet (20 mg total) by mouth daily.   cephALEXin  (KEFLEX ) 500 MG capsule Take 1 capsule (500 mg total) by mouth 3 (three) times daily. (Patient not taking: Reported on 12/02/2023)   meloxicam  (MOBIC ) 15 MG tablet Take 1 tablet (15 mg total) by mouth daily as needed. (Patient not taking: Reported on 12/02/2023)   No facility-administered medications prior to visit.    Review of Systems  Constitutional: Negative.  Negative for chills, fever, malaise/fatigue and weight loss.  HENT: Negative.  Negative for sore throat.   Eyes: Negative.   Respiratory: Negative.  Negative for cough and shortness of breath.   Cardiovascular: Negative.  Negative for chest pain, palpitations and leg swelling.  Gastrointestinal: Negative.  Negative for abdominal pain, constipation, diarrhea, heartburn, nausea and vomiting.  Genitourinary: Negative.  Negative for dysuria and flank pain.  Musculoskeletal: Negative.  Negative for joint pain and myalgias.  Skin: Negative.   Neurological: Negative.  Negative for dizziness, tingling, tremors and headaches.  Endo/Heme/Allergies: Negative.   Psychiatric/Behavioral: Negative.  Negative for depression and suicidal ideas. The patient is not nervous/anxious.        Objective:   BP 122/78   Pulse 67   Ht 5\' 1"  (1.549 m)   Wt 183 lb 6.4 oz (83.2 kg)   SpO2 98%   BMI 34.65 kg/m   Vitals:   12/02/23 1518  BP: 122/78  Pulse: 67  Height: 5\' 1"  (1.549 m)  Weight: 183 lb 6.4 oz (83.2 kg)   SpO2: 98%  BMI (Calculated): 34.67    Physical Exam Vitals and nursing note reviewed.  Constitutional:      Appearance: Normal appearance.  HENT:     Head: Normocephalic and atraumatic.     Nose: Nose normal.     Mouth/Throat:     Mouth: Mucous membranes are moist.     Pharynx: Oropharynx is clear.  Eyes:     Conjunctiva/sclera: Conjunctivae normal.     Pupils: Pupils are equal, round, and reactive to light.  Cardiovascular:     Rate and Rhythm: Normal rate and regular rhythm.     Pulses: Normal pulses.     Heart sounds: Normal heart sounds. No murmur heard. Pulmonary:     Effort: Pulmonary effort is normal.     Breath sounds: Normal breath sounds. No wheezing.  Abdominal:     General: Bowel sounds are normal.     Palpations: Abdomen is soft.     Tenderness: There is no abdominal tenderness. There is no right CVA tenderness or left CVA tenderness.  Musculoskeletal:        General: Normal range of motion.     Cervical back: Normal range of motion.     Right lower leg: No edema.     Left lower leg: No edema.  Skin:    General: Skin is warm and dry.  Neurological:     General: No focal deficit present.     Mental Status: She is alert and oriented to person, place, and time.  Psychiatric:        Mood and Affect: Mood normal.        Behavior: Behavior normal.      No results found for any visits on 12/02/23.  Recent Results (from the past 2160 hours)  POCT CBG (Fasting - Glucose)     Status: Abnormal   Collection Time: 10/11/23  3:18 PM  Result Value Ref Range   Glucose Fasting, POC 100 (A) 70 - 99 mg/dL  CBC with Diff     Status: Abnormal   Collection Time: 10/12/23  3:24 PM  Result Value Ref Range   WBC 11.0 (H) 3.4 - 10.8 x10E3/uL   RBC 4.43 3.77 - 5.28 x10E6/uL   Hemoglobin 12.5 11.1 - 15.9 g/dL   Hematocrit 16.1 09.6 - 46.6 %   MCV 85 79 - 97 fL   MCH 28.2 26.6 - 33.0 pg   MCHC 33.2 31.5 - 35.7 g/dL   RDW 04.5 40.9 - 81.1 %   Platelets 371 150 - 450  x10E3/uL   Neutrophils 68 Not Estab. %   Lymphs 21 Not Estab. %   Monocytes 7 Not Estab. %   Eos 2 Not Estab. %  Basos 1 Not Estab. %   Neutrophils Absolute 7.5 (H) 1.4 - 7.0 x10E3/uL   Lymphocytes Absolute 2.3 0.7 - 3.1 x10E3/uL   Monocytes Absolute 0.8 0.1 - 0.9 x10E3/uL   EOS (ABSOLUTE) 0.2 0.0 - 0.4 x10E3/uL   Basophils Absolute 0.1 0.0 - 0.2 x10E3/uL   Immature Granulocytes 1 Not Estab. %   Immature Grans (Abs) 0.1 0.0 - 0.1 x10E3/uL  CMP14+EGFR     Status: Abnormal   Collection Time: 10/12/23  3:24 PM  Result Value Ref Range   Glucose 94 70 - 99 mg/dL   BUN 12 8 - 27 mg/dL   Creatinine, Ser 4.09 0.57 - 1.00 mg/dL   eGFR 88 >81 XB/JYN/8.29   BUN/Creatinine Ratio 16 12 - 28   Sodium 140 134 - 144 mmol/L   Potassium 3.7 3.5 - 5.2 mmol/L   Chloride 102 96 - 106 mmol/L   CO2 24 20 - 29 mmol/L   Calcium 9.2 8.7 - 10.3 mg/dL   Total Protein 7.1 6.0 - 8.5 g/dL   Albumin 4.2 3.8 - 4.9 g/dL   Globulin, Total 2.9 1.5 - 4.5 g/dL   Bilirubin Total 0.2 0.0 - 1.2 mg/dL   Alkaline Phosphatase 102 44 - 121 IU/L   AST 25 0 - 40 IU/L   ALT 34 (H) 0 - 32 IU/L  Lipid Panel w/o Chol/HDL Ratio     Status: None   Collection Time: 10/12/23  3:24 PM  Result Value Ref Range   Cholesterol, Total 175 100 - 199 mg/dL   Triglycerides 562 0 - 149 mg/dL   HDL 55 >13 mg/dL   VLDL Cholesterol Cal 21 5 - 40 mg/dL   LDL Chol Calc (NIH) 99 0 - 99 mg/dL  Hemoglobin Y8M     Status: Abnormal   Collection Time: 10/12/23  3:24 PM  Result Value Ref Range   Hgb A1c MFr Bld 6.0 (H) 4.8 - 5.6 %    Comment:          Prediabetes: 5.7 - 6.4          Diabetes: >6.4          Glycemic control for adults with diabetes: <7.0    Est. average glucose Bld gHb Est-mCnc 126 mg/dL      Assessment & Plan:  Continue current medications as advised.  Check potassium level. Problem List Items Addressed This Visit     Type 2 diabetes mellitus with hyperglycemia, without long-term current use of insulin (HCC)    Hypertension associated with diabetes (HCC) - Primary   Relevant Medications   furosemide  (LASIX ) 20 MG tablet   Other Relevant Orders   Potassium   Combined hyperlipidemia associated with type 2 diabetes mellitus (HCC)   Relevant Medications   furosemide  (LASIX ) 20 MG tablet   Lower leg edema   Relevant Medications   furosemide  (LASIX ) 20 MG tablet    Return in about 2 months (around 02/01/2024).   Total time spent: 30 minutes  Aisha Hove, MD  12/02/2023   This document may have been prepared by [facility] Voice Recognition software and as such may include unintentional dictation errors.

## 2023-12-03 ENCOUNTER — Ambulatory Visit: Payer: Self-pay | Admitting: Internal Medicine

## 2023-12-03 LAB — POTASSIUM: Potassium: 4.3 mmol/L (ref 3.5–5.2)

## 2023-12-03 NOTE — Progress Notes (Signed)
 Patient notified

## 2023-12-21 ENCOUNTER — Other Ambulatory Visit: Payer: Self-pay | Admitting: Internal Medicine

## 2023-12-21 DIAGNOSIS — E782 Mixed hyperlipidemia: Secondary | ICD-10-CM

## 2024-01-06 ENCOUNTER — Encounter: Payer: Self-pay | Admitting: Internal Medicine

## 2024-01-19 ENCOUNTER — Other Ambulatory Visit: Payer: Self-pay | Admitting: Internal Medicine

## 2024-01-19 DIAGNOSIS — E1165 Type 2 diabetes mellitus with hyperglycemia: Secondary | ICD-10-CM

## 2024-01-24 ENCOUNTER — Other Ambulatory Visit

## 2024-01-24 DIAGNOSIS — E1165 Type 2 diabetes mellitus with hyperglycemia: Secondary | ICD-10-CM

## 2024-01-24 DIAGNOSIS — E1159 Type 2 diabetes mellitus with other circulatory complications: Secondary | ICD-10-CM

## 2024-01-24 DIAGNOSIS — I10 Essential (primary) hypertension: Secondary | ICD-10-CM

## 2024-01-24 DIAGNOSIS — E1169 Type 2 diabetes mellitus with other specified complication: Secondary | ICD-10-CM

## 2024-01-25 ENCOUNTER — Ambulatory Visit: Payer: Self-pay | Admitting: Internal Medicine

## 2024-01-25 LAB — CMP14+EGFR
ALT: 17 IU/L (ref 0–32)
AST: 15 IU/L (ref 0–40)
Albumin: 4 g/dL (ref 3.8–4.9)
Alkaline Phosphatase: 100 IU/L (ref 44–121)
BUN/Creatinine Ratio: 19 (ref 12–28)
BUN: 12 mg/dL (ref 8–27)
Bilirubin Total: <0.2 mg/dL (ref 0.0–1.2)
CO2: 22 mmol/L (ref 20–29)
Calcium: 8.8 mg/dL (ref 8.7–10.3)
Chloride: 106 mmol/L (ref 96–106)
Creatinine, Ser: 0.62 mg/dL (ref 0.57–1.00)
Globulin, Total: 2.8 g/dL (ref 1.5–4.5)
Glucose: 89 mg/dL (ref 70–99)
Potassium: 4.1 mmol/L (ref 3.5–5.2)
Sodium: 143 mmol/L (ref 134–144)
Total Protein: 6.8 g/dL (ref 6.0–8.5)
eGFR: 102 mL/min/1.73 (ref >59)

## 2024-01-25 LAB — CBC WITH DIFFERENTIAL/PLATELET
Basophils Absolute: 0.1 x10E3/uL (ref 0.0–0.2)
Basos: 1 %
EOS (ABSOLUTE): 0.2 x10E3/uL (ref 0.0–0.4)
Eos: 2 %
Hematocrit: 39.9 % (ref 34.0–46.6)
Hemoglobin: 12.7 g/dL (ref 11.1–15.9)
Immature Grans (Abs): 0 x10E3/uL (ref 0.0–0.1)
Immature Granulocytes: 0 %
Lymphocytes Absolute: 2.3 x10E3/uL (ref 0.7–3.1)
Lymphs: 25 %
MCH: 28.7 pg (ref 26.6–33.0)
MCHC: 31.8 g/dL (ref 31.5–35.7)
MCV: 90 fL (ref 79–97)
Monocytes Absolute: 0.5 x10E3/uL (ref 0.1–0.9)
Monocytes: 6 %
Neutrophils Absolute: 6.3 x10E3/uL (ref 1.4–7.0)
Neutrophils: 66 %
Platelets: 351 x10E3/uL (ref 150–450)
RBC: 4.42 x10E6/uL (ref 3.77–5.28)
RDW: 14.1 % (ref 11.7–15.4)
WBC: 9.3 x10E3/uL (ref 3.4–10.8)

## 2024-01-25 LAB — LIPID PANEL W/O CHOL/HDL RATIO
Cholesterol, Total: 138 mg/dL (ref 100–199)
HDL: 63 mg/dL (ref >39)
LDL Chol Calc (NIH): 62 mg/dL (ref 0–99)
Triglycerides: 61 mg/dL (ref 0–149)
VLDL Cholesterol Cal: 13 mg/dL (ref 5–40)

## 2024-01-25 LAB — HEMOGLOBIN A1C
Est. average glucose Bld gHb Est-mCnc: 126 mg/dL
Hgb A1c MFr Bld: 6 % — ABNORMAL HIGH (ref 4.8–5.6)

## 2024-02-01 ENCOUNTER — Ambulatory Visit: Admitting: Cardiology

## 2024-02-08 ENCOUNTER — Ambulatory Visit: Payer: Self-pay | Admitting: Internal Medicine

## 2024-02-08 ENCOUNTER — Encounter: Payer: Self-pay | Admitting: Internal Medicine

## 2024-02-08 ENCOUNTER — Ambulatory Visit (INDEPENDENT_AMBULATORY_CARE_PROVIDER_SITE_OTHER): Admitting: Internal Medicine

## 2024-02-08 VITALS — BP 118/78 | HR 65 | Ht 61.0 in | Wt 190.4 lb

## 2024-02-08 DIAGNOSIS — Z1211 Encounter for screening for malignant neoplasm of colon: Secondary | ICD-10-CM

## 2024-02-08 DIAGNOSIS — Z0001 Encounter for general adult medical examination with abnormal findings: Secondary | ICD-10-CM

## 2024-02-08 DIAGNOSIS — E1165 Type 2 diabetes mellitus with hyperglycemia: Secondary | ICD-10-CM | POA: Diagnosis not present

## 2024-02-08 DIAGNOSIS — E782 Mixed hyperlipidemia: Secondary | ICD-10-CM | POA: Diagnosis not present

## 2024-02-08 DIAGNOSIS — E1159 Type 2 diabetes mellitus with other circulatory complications: Secondary | ICD-10-CM | POA: Diagnosis not present

## 2024-02-08 DIAGNOSIS — Z1231 Encounter for screening mammogram for malignant neoplasm of breast: Secondary | ICD-10-CM

## 2024-02-08 DIAGNOSIS — Z0000 Encounter for general adult medical examination without abnormal findings: Secondary | ICD-10-CM

## 2024-02-08 DIAGNOSIS — E1169 Type 2 diabetes mellitus with other specified complication: Secondary | ICD-10-CM

## 2024-02-08 DIAGNOSIS — I152 Hypertension secondary to endocrine disorders: Secondary | ICD-10-CM

## 2024-02-08 DIAGNOSIS — Z1389 Encounter for screening for other disorder: Secondary | ICD-10-CM

## 2024-02-08 LAB — POCT URINALYSIS DIPSTICK
Bilirubin, UA: NEGATIVE
Glucose, UA: NEGATIVE
Ketones, UA: NEGATIVE
Nitrite, UA: NEGATIVE
Protein, UA: NEGATIVE
Spec Grav, UA: 1.015 (ref 1.010–1.025)
Urobilinogen, UA: 0.2 U/dL
pH, UA: 7 (ref 5.0–8.0)

## 2024-02-08 LAB — POC CREATINE & ALBUMIN,URINE
Creatinine, POC: 50 mg/dL
Microalbumin Ur, POC: 10 mg/L

## 2024-02-08 LAB — POCT CBG (FASTING - GLUCOSE)-MANUAL ENTRY: Glucose Fasting, POC: 97 mg/dL (ref 70–99)

## 2024-02-08 NOTE — Progress Notes (Signed)
 Established Patient Office Visit  Subjective:  Patient ID: Diana Bonilla, female    DOB: 04/21/1961  Age: 63 y.o. MRN: [id number]  Chief Complaint  Patient presents with   Annual Exam    CPE, 2 month follow up    Patient comes in for her complete physical.  She is generally feeling well and has no new complaints.  She does not have any more ankle edema.  Denies chest pain no shortness of breath and no palpitations.  Her labs were done recently and results were discussed.  Her mammogram is due for August will schedule. Her last DEXA was in 2024 Pap smear negative in 2022. Last colonoscopy 2015, will schedule 1 for this year.    No other concerns at this time.   Past Medical History:  Diagnosis Date   Diabetes mellitus without complication (HCC)    Hypertension     No past surgical history on file.  Social History   Socioeconomic History   Marital status: Married    Spouse name: Not on file   Number of children: Not on file   Years of education: Not on file   Highest education level: Not on file  Occupational History   Not on file  Tobacco Use   Smoking status: Never   Smokeless tobacco: Never  Vaping Use   Vaping status: Never Used  Substance and Sexual Activity   Alcohol use: Not Currently   Drug use: Never   Sexual activity: Not on file  Other Topics Concern   Not on file  Social History Narrative   Not on file   Social Drivers of Health   Financial Resource Strain: Not on file  Food Insecurity: Not on file  Transportation Needs: Not on file  Physical Activity: Not on file  Stress: Not on file  Social Connections: Not on file  Intimate Partner Violence: Not on file    Family History  Problem Relation Age of Onset   Osteoporosis Mother    Hypertension Father    Breast cancer Neg Hx     No Known Allergies  Outpatient Medications Prior to Visit  Medication Sig   Cholecalciferol (VITAMIN D3) 1.25 MG (50000 UT) CAPS TAKE 1 CAPSULE BY MOUTH ONCE  WEEKLY.   furosemide  (LASIX ) 20 MG tablet Take 1 tablet (20 mg total) by mouth daily.   losartan -hydrochlorothiazide (HYZAAR) 100-25 MG tablet Take 1 tablet by mouth daily.   metFORMIN (GLUCOPHAGE-XR) 500 MG 24 hr tablet TAKE 1 TABLET BY MOUTH TWICE A DAY   sertraline (ZOLOFT) 50 MG tablet TAKE 1 TABLET BY MOUTH DAILY   simvastatin (ZOCOR) 20 MG tablet TAKE 1 TABLET BY MOUTH DAILY   verapamil  (CALAN -SR) 240 MG CR tablet TAKE 1 TABLET BY MOUTH DAILY   cephALEXin  (KEFLEX ) 500 MG capsule Take 1 capsule (500 mg total) by mouth 3 (three) times daily. (Patient not taking: Reported on 02/08/2024)   meloxicam  (MOBIC ) 15 MG tablet Take 1 tablet (15 mg total) by mouth daily as needed. (Patient not taking: Reported on 02/08/2024)   No facility-administered medications prior to visit.    Review of Systems  Constitutional: Negative.  Negative for chills, diaphoresis, fever, malaise/fatigue and weight loss.  HENT: Negative.  Negative for nosebleeds and sore throat.   Eyes: Negative.   Respiratory: Negative.  Negative for cough, shortness of breath and stridor.   Cardiovascular: Negative.  Negative for chest pain, palpitations and leg swelling.  Gastrointestinal: Negative.  Negative for abdominal pain, constipation, diarrhea, heartburn,  nausea and vomiting.  Genitourinary: Negative.  Negative for dysuria and flank pain.  Musculoskeletal: Negative.  Negative for joint pain and myalgias.  Skin: Negative.   Neurological: Negative.  Negative for dizziness, tingling, tremors, sensory change and headaches.  Endo/Heme/Allergies: Negative.   Psychiatric/Behavioral: Negative.  Negative for depression and suicidal ideas. The patient is not nervous/anxious.        Objective:   BP 118/78   Pulse 65   Ht 5' 1 (1.549 m)   Wt 190 lb 6.4 oz (86.4 kg)   SpO2 97%   BMI 35.98 kg/m   Vitals:   02/08/24 1512  BP: 118/78  Pulse: 65  Height: 5' 1 (1.549 m)  Weight: 190 lb 6.4 oz (86.4 kg)  SpO2: 97%  BMI  (Calculated): 35.99    Physical Exam Vitals and nursing note reviewed.  Constitutional:      Appearance: Normal appearance.  HENT:     Head: Normocephalic and atraumatic.     Nose: Nose normal.     Mouth/Throat:     Mouth: Mucous membranes are moist.     Pharynx: Oropharynx is clear.  Eyes:     Conjunctiva/sclera: Conjunctivae normal.     Pupils: Pupils are equal, round, and reactive to light.  Cardiovascular:     Rate and Rhythm: Normal rate and regular rhythm.     Pulses: Normal pulses.     Heart sounds: Normal heart sounds. No murmur heard. Pulmonary:     Effort: Pulmonary effort is normal.     Breath sounds: Normal breath sounds. No wheezing.  Abdominal:     General: Bowel sounds are normal.     Palpations: Abdomen is soft.     Tenderness: There is no abdominal tenderness. There is no right CVA tenderness or left CVA tenderness.  Musculoskeletal:        General: Normal range of motion.     Cervical back: Normal range of motion.     Right lower leg: No edema.     Left lower leg: No edema.  Skin:    General: Skin is warm and dry.  Neurological:     General: No focal deficit present.     Mental Status: She is alert and oriented to person, place, and time.  Psychiatric:        Mood and Affect: Mood normal.        Behavior: Behavior normal.      Results for orders placed or performed in visit on 02/08/24  POCT CBG (Fasting - Glucose)  Result Value Ref Range   Glucose Fasting, POC 97 70 - 99 mg/dL  POCT Urinalysis Dipstick (81002)  Result Value Ref Range   Color, UA Yellow    Clarity, UA Clear    Glucose, UA Negative Negative   Bilirubin, UA Negative    Ketones, UA Negative    Spec Grav, UA 1.015 1.010 - 1.025   Blood, UA Small    pH, UA 7.0 5.0 - 8.0   Protein, UA Negative Negative   Urobilinogen, UA 0.2 0.2 or 1.0 E.U./dL   Nitrite, UA Negative    Leukocytes, UA Trace (A) Negative   Appearance Clear    Odor No     Recent Results (from the past 2160  hours)  Potassium     Status: None   Collection Time: 12/02/23  3:41 PM  Result Value Ref Range   Potassium 4.3 3.5 - 5.2 mmol/L  Hemoglobin A1c     Status: Abnormal  Collection Time: 01/24/24  9:10 AM  Result Value Ref Range   Hgb A1c MFr Bld 6.0 (H) 4.8 - 5.6 %    Comment:          Prediabetes: 5.7 - 6.4          Diabetes: >6.4          Glycemic control for adults with diabetes: <7.0    Est. average glucose Bld gHb Est-mCnc 126 mg/dL  Lipid Panel w/o Chol/HDL Ratio     Status: None   Collection Time: 01/24/24  9:10 AM  Result Value Ref Range   Cholesterol, Total 138 100 - 199 mg/dL   Triglycerides 61 0 - 149 mg/dL   HDL 63 >60 mg/dL   VLDL Cholesterol Cal 13 5 - 40 mg/dL   LDL Chol Calc (NIH) 62 0 - 99 mg/dL  [id number]     Status: None   Collection Time: 01/24/24  9:10 AM  Result Value Ref Range   Glucose 89 70 - 99 mg/dL   BUN 12 8 - 27 mg/dL   Creatinine, Ser 9.37 0.57 - 1.00 mg/dL   eGFR 897 >40 fO/fpw/8.26   BUN/Creatinine Ratio 19 12 - 28   Sodium 143 134 - 144 mmol/L   Potassium 4.1 3.5 - 5.2 mmol/L   Chloride 106 96 - 106 mmol/L   CO2 22 20 - 29 mmol/L   Calcium 8.8 8.7 - 10.3 mg/dL   Total Protein 6.8 6.0 - 8.5 g/dL   Albumin 4.0 3.8 - 4.9 g/dL   Globulin, Total 2.8 1.5 - 4.5 g/dL   Bilirubin Total <9.7 0.0 - 1.2 mg/dL   Alkaline Phosphatase 100 44 - 121 IU/L   AST 15 0 - 40 IU/L   ALT 17 0 - 32 IU/L  CBC with Diff     Status: None   Collection Time: 01/24/24  9:10 AM  Result Value Ref Range   WBC 9.3 3.4 - 10.8 x10E3/uL   RBC 4.42 3.77 - 5.28 x10E6/uL   Hemoglobin 12.7 11.1 - 15.9 g/dL   Hematocrit 60.0 65.9 - 46.6 %   MCV 90 79 - 97 fL   MCH 28.7 26.6 - 33.0 pg   MCHC 31.8 31.5 - 35.7 g/dL   RDW 85.8 88.2 - 84.5 %   Platelets 351 150 - 450 x10E3/uL   Neutrophils 66 Not Estab. %   Lymphs 25 Not Estab. %   Monocytes 6 Not Estab. %   Eos 2 Not Estab. %   Basos 1 Not Estab. %   Neutrophils Absolute 6.3 1.4 - 7.0 x10E3/uL   Lymphocytes Absolute 2.3  0.7 - 3.1 x10E3/uL   Monocytes Absolute 0.5 0.1 - 0.9 x10E3/uL   EOS (ABSOLUTE) 0.2 0.0 - 0.4 x10E3/uL   Basophils Absolute 0.1 0.0 - 0.2 x10E3/uL   Immature Granulocytes 0 Not Estab. %   Immature Grans (Abs) 0.0 0.0 - 0.1 x10E3/uL  POCT CBG (Fasting - Glucose)     Status: None   Collection Time: 02/08/24  3:18 PM  Result Value Ref Range   Glucose Fasting, POC 97 70 - 99 mg/dL  POCT Urinalysis Dipstick (18997)     Status: Abnormal   Collection Time: 02/08/24  3:19 PM  Result Value Ref Range   Color, UA Yellow    Clarity, UA Clear    Glucose, UA Negative Negative   Bilirubin, UA Negative    Ketones, UA Negative    Spec Grav, UA 1.015 1.010 - 1.025  Blood, UA Small    pH, UA 7.0 5.0 - 8.0   Protein, UA Negative Negative   Urobilinogen, UA 0.2 0.2 or 1.0 E.U./dL   Nitrite, UA Negative    Leukocytes, UA Trace (A) Negative   Appearance Clear    Odor No       Assessment & Plan:   Problem List Items Addressed This Visit     Breast cancer screening by mammogram   Relevant Orders   MM 3D SCREENING MAMMOGRAM BILATERAL BREAST   Type 2 diabetes mellitus with hyperglycemia, without long-term current use of insulin (HCC)   Relevant Orders   POCT CBG (Fasting - Glucose) (Completed)   Hypertension associated with diabetes (HCC)   Combined hyperlipidemia associated with type 2 diabetes mellitus (HCC)   Other Visit Diagnoses       Annual physical exam    -  Primary     Screening for blood or protein in urine       Relevant Orders   POCT Urinalysis Dipstick (18997) (Completed)     Colon cancer screening       Relevant Orders   Ambulatory referral to Gastroenterology       Return in about 3 months (around 05/10/2024).   Total time spent: 30 minutes  FERNAND FREDY RAMAN, MD  02/08/2024   This document may have been prepared by [facility] Voice Recognition software and as such may include unintentional dictation errors.

## 2024-03-21 ENCOUNTER — Ambulatory Visit
Admission: RE | Admit: 2024-03-21 | Discharge: 2024-03-21 | Disposition: A | Discharge status: Home / Self Care | Source: Ambulatory Visit | Attending: Internal Medicine | Admitting: Internal Medicine

## 2024-03-21 DIAGNOSIS — Z1231 Encounter for screening mammogram for malignant neoplasm of breast: Secondary | ICD-10-CM | POA: Diagnosis present

## 2024-04-20 ENCOUNTER — Other Ambulatory Visit: Payer: Self-pay | Admitting: Internal Medicine

## 2024-05-11 ENCOUNTER — Ambulatory Visit: Payer: Self-pay | Admitting: Internal Medicine

## 2024-05-11 ENCOUNTER — Ambulatory Visit (INDEPENDENT_AMBULATORY_CARE_PROVIDER_SITE_OTHER): Admitting: Internal Medicine

## 2024-05-11 ENCOUNTER — Encounter: Payer: Self-pay | Admitting: Internal Medicine

## 2024-05-11 VITALS — BP 124/76 | HR 70 | Ht 61.0 in | Wt 194.2 lb

## 2024-05-11 DIAGNOSIS — E1165 Type 2 diabetes mellitus with hyperglycemia: Secondary | ICD-10-CM

## 2024-05-11 DIAGNOSIS — R309 Painful micturition, unspecified: Secondary | ICD-10-CM | POA: Insufficient documentation

## 2024-05-11 DIAGNOSIS — M1711 Unilateral primary osteoarthritis, right knee: Secondary | ICD-10-CM

## 2024-05-11 DIAGNOSIS — E782 Mixed hyperlipidemia: Secondary | ICD-10-CM

## 2024-05-11 DIAGNOSIS — E1169 Type 2 diabetes mellitus with other specified complication: Secondary | ICD-10-CM

## 2024-05-11 DIAGNOSIS — E1159 Type 2 diabetes mellitus with other circulatory complications: Secondary | ICD-10-CM | POA: Diagnosis not present

## 2024-05-11 DIAGNOSIS — Z23 Encounter for immunization: Secondary | ICD-10-CM | POA: Insufficient documentation

## 2024-05-11 DIAGNOSIS — I152 Hypertension secondary to endocrine disorders: Secondary | ICD-10-CM | POA: Diagnosis not present

## 2024-05-11 LAB — POCT CBG (FASTING - GLUCOSE)-MANUAL ENTRY: Glucose Fasting, POC: 105 mg/dL — AB (ref 70–99)

## 2024-05-11 LAB — POCT URINALYSIS DIPSTICK
Bilirubin, UA: NEGATIVE
Blood, UA: POSITIVE
Glucose, UA: NEGATIVE
Ketones, UA: NEGATIVE
Nitrite, UA: NEGATIVE
Protein, UA: POSITIVE — AB
Spec Grav, UA: 1.02 (ref 1.010–1.025)
Urobilinogen, UA: 0.2 U/dL
pH, UA: 6.5 (ref 5.0–8.0)

## 2024-05-11 MED ORDER — NITROFURANTOIN MONOHYD MACRO 100 MG PO CAPS: 100.0000 mg | ORAL_CAPSULE | Freq: Two times a day (BID) | ORAL | 0 refills | Status: AC

## 2024-05-11 NOTE — Progress Notes (Unsigned)
 Established Patient Office Visit  Subjective:  Patient ID: Diana Bonilla, female    DOB: 12-Aug-1960  Age: 63 y.o. MRN: [id number]  Chief Complaint  Patient presents with   Follow-up    3 month follow up    Patient is here today for follow up. She has gotten her mammogram completed but still awaiting call from GI to schedule colonoscopy.  She has complaints of dysuria with increased frequency that began yesterday. UA showed trace leukocytes and positive for blood. Will send for culture and start empiric Macrobid for 7 days.  She reports worsening right knee pain; gets joint injections at [facility] but has not seen them since 01/2024. She reports they discussed her needing surgery. She wears leg brace when needed but endorses inability to exercise much because of her pain. Recommended patient to contact her Orthopedic specialist and FU with them to discuss her options at this time.  Patient is due for lab work today. Patient wants flu shot today.     No other concerns at this time.   Past Medical History:  Diagnosis Date   Diabetes mellitus without complication (HCC)    Hypertension     No past surgical history on file.  Social History   Socioeconomic History   Marital status: Married    Spouse name: Not on file   Number of children: Not on file   Years of education: Not on file   Highest education level: Not on file  Occupational History   Not on file  Tobacco Use   Smoking status: Never   Smokeless tobacco: Never  Vaping Use   Vaping status: Never Used  Substance and Sexual Activity   Alcohol use: Not Currently   Drug use: Never   Sexual activity: Not on file  Other Topics Concern   Not on file  Social History Narrative   Not on file   Social Drivers of Health   Financial Resource Strain: Not on file  Food Insecurity: Not on file  Transportation Needs: Not on file  Physical Activity: Not on file  Stress: Not on file  Social Connections: Not on file   Intimate Partner Violence: Not on file    Family History  Problem Relation Age of Onset   Osteoporosis Mother    Hypertension Father    Breast cancer Neg Hx     No Known Allergies  Outpatient Medications Prior to Visit  Medication Sig   amLODipine  (NORVASC ) 5 MG tablet Take 5 mg by mouth daily.   losartan -hydrochlorothiazide (HYZAAR) 100-25 MG tablet Take 1 tablet by mouth daily.   metFORMIN (GLUCOPHAGE-XR) 500 MG 24 hr tablet TAKE 1 TABLET BY MOUTH TWICE A DAY   sertraline (ZOLOFT) 50 MG tablet TAKE 1 TABLET BY MOUTH DAILY   simvastatin (ZOCOR) 20 MG tablet TAKE 1 TABLET BY MOUTH DAILY   verapamil  (CALAN -SR) 240 MG CR tablet TAKE 1 TABLET BY MOUTH DAILY   Cholecalciferol (VITAMIN D3) 1.25 MG (50000 UT) CAPS TAKE 1 CAPSULE BY MOUTH ONCE WEEKLY. (Patient not taking: Reported on 05/11/2024)   furosemide  (LASIX ) 20 MG tablet Take 1 tablet (20 mg total) by mouth daily. (Patient not taking: Reported on 05/11/2024)   meloxicam  (MOBIC ) 15 MG tablet Take 1 tablet (15 mg total) by mouth daily as needed. (Patient not taking: Reported on 05/11/2024)   [DISCONTINUED] cephALEXin  (KEFLEX ) 500 MG capsule Take 1 capsule (500 mg total) by mouth 3 (three) times daily. (Patient not taking: Reported on 05/11/2024)   No  facility-administered medications prior to visit.    Review of Systems  Constitutional: Negative.  Negative for chills, fever and malaise/fatigue.  HENT: Negative.  Negative for congestion and sore throat.   Eyes: Negative.  Negative for blurred vision and pain.  Respiratory: Negative.  Negative for cough and shortness of breath.   Cardiovascular: Negative.  Negative for chest pain, palpitations and leg swelling.  Gastrointestinal: Negative.  Negative for abdominal pain, blood in stool, constipation, diarrhea, heartburn, melena, nausea and vomiting.  Genitourinary:  Positive for dysuria and frequency. Negative for flank pain and urgency.  Musculoskeletal:  Positive for joint pain  (right knee). Negative for myalgias.  Skin: Negative.   Neurological: Negative.  Negative for dizziness, tingling, sensory change, weakness and headaches.  Endo/Heme/Allergies: Negative.   Psychiatric/Behavioral: Negative.  Negative for depression and suicidal ideas. The patient is not nervous/anxious.        Objective:   BP 124/76   Pulse 70   Ht 5' 1 (1.549 m)   Wt 194 lb 3.2 oz (88.1 kg)   SpO2 98%   BMI 36.69 kg/m   Vitals:   05/11/24 1516  BP: 124/76  Pulse: 70  Height: 5' 1 (1.549 m)  Weight: 194 lb 3.2 oz (88.1 kg)  SpO2: 98%  BMI (Calculated): 36.71    Physical Exam Vitals and nursing note reviewed.  Constitutional:      Appearance: Normal appearance.  HENT:     Head: Normocephalic and atraumatic.     Nose: Nose normal.     Mouth/Throat:     Mouth: Mucous membranes are moist.     Pharynx: Oropharynx is clear.  Eyes:     Conjunctiva/sclera: Conjunctivae normal.     Pupils: Pupils are equal, round, and reactive to light.  Cardiovascular:     Rate and Rhythm: Normal rate and regular rhythm.     Pulses: Normal pulses.     Heart sounds: Normal heart sounds. No murmur heard. Pulmonary:     Effort: Pulmonary effort is normal.     Breath sounds: Normal breath sounds. No wheezing.  Abdominal:     General: Bowel sounds are normal.     Palpations: Abdomen is soft.     Tenderness: There is no abdominal tenderness. There is no right CVA tenderness or left CVA tenderness.  Musculoskeletal:        General: Normal range of motion.     Cervical back: Normal range of motion.     Right lower leg: Bony tenderness present. No edema.     Left lower leg: No edema.     Comments: Right knee abducts with ambulation   Skin:    General: Skin is warm and dry.  Neurological:     General: No focal deficit present.     Mental Status: She is alert and oriented to person, place, and time.  Psychiatric:        Mood and Affect: Mood normal.        Behavior: Behavior normal.       Results for orders placed or performed in visit on 05/11/24  POCT urinalysis dipstick  Result Value Ref Range   Color, UA Yellow    Clarity, UA Clear    Glucose, UA Negative Negative   Bilirubin, UA Negative    Ketones, UA Negative    Spec Grav, UA 1.020 1.010 - 1.025   Blood, UA Positive    pH, UA 6.5 5.0 - 8.0   Protein, UA Positive (A) Negative  Urobilinogen, UA 0.2 0.2 or 1.0 E.U./dL   Nitrite, UA Negative    Leukocytes, UA Trace (A) Negative   Appearance     Odor    POCT CBG (Fasting - Glucose)  Result Value Ref Range   Glucose Fasting, POC 105 (A) 70 - 99 mg/dL    Recent Results (from the past 2160 hours)  POCT CBG (Fasting - Glucose)     Status: Abnormal   Collection Time: 05/11/24  3:22 PM  Result Value Ref Range   Glucose Fasting, POC 105 (A) 70 - 99 mg/dL  POCT urinalysis dipstick     Status: Abnormal   Collection Time: 05/11/24  3:25 PM  Result Value Ref Range   Color, UA Yellow    Clarity, UA Clear    Glucose, UA Negative Negative   Bilirubin, UA Negative    Ketones, UA Negative    Spec Grav, UA 1.020 1.010 - 1.025   Blood, UA Positive    pH, UA 6.5 5.0 - 8.0   Protein, UA Positive (A) Negative   Urobilinogen, UA 0.2 0.2 or 1.0 E.U./dL   Nitrite, UA Negative    Leukocytes, UA Trace (A) Negative   Appearance     Odor        Assessment & Plan:  UA and culture. Start Macrobid for dysuria. Recommend FU with orthopedic for right knee pain. Flu shot given. Routine labs collected today and FU with patient on results. Reinforced healthy diet and exercise as tolerated. Problem List Items Addressed This Visit     Osteoarthritis of right knee   Type 2 diabetes mellitus with hyperglycemia, without long-term current use of insulin (HCC) - Primary   Relevant Orders   POCT CBG (Fasting - Glucose) (Completed)   Hemoglobin A1c   Hypertension associated with diabetes (HCC)   Relevant Medications   amLODipine  (NORVASC ) 5 MG tablet   Other Relevant Orders    CMP14+EGFR   CBC with Diff   Combined hyperlipidemia associated with type 2 diabetes mellitus (HCC)   Relevant Medications   amLODipine  (NORVASC ) 5 MG tablet   Other Relevant Orders   CMP14+EGFR   Lipid Panel w/o Chol/HDL Ratio   CBC with Diff   Painful urination   Relevant Medications   nitrofurantoin, macrocrystal-monohydrate, (MACROBID) 100 MG capsule   Other Relevant Orders   POCT urinalysis dipstick (Completed)   Needs flu shot   Relevant Orders   Flu vaccine trivalent PF, 6mos and older(Flulaval,Afluria,Fluarix,Fluzone)    Return in about 2 weeks (around 05/25/2024).   Total time spent: {AMA time spent:29001} minutes. This time includes review of previous notes and results and patient face to face interaction during today's visit.    FERNAND FREDY RAMAN, MD  05/11/2024   This document may have been prepared by [facility] Voice Recognition software and as such may include unintentional dictation errors.

## 2024-05-12 ENCOUNTER — Encounter: Payer: Self-pay | Admitting: Internal Medicine

## 2024-05-12 ENCOUNTER — Other Ambulatory Visit

## 2024-05-13 LAB — CMP14+EGFR
ALT: 16 IU/L (ref 0–32)
AST: 17 IU/L (ref 0–40)
Albumin: 4.2 g/dL (ref 3.9–4.9)
Alkaline Phosphatase: 93 IU/L (ref 49–135)
BUN/Creatinine Ratio: 20 (ref 12–28)
BUN: 12 mg/dL (ref 8–27)
Bilirubin Total: 0.2 mg/dL (ref 0.0–1.2)
CO2: 26 mmol/L (ref 20–29)
Calcium: 9.1 mg/dL (ref 8.7–10.3)
Chloride: 101 mmol/L (ref 96–106)
Creatinine, Ser: 0.6 mg/dL (ref 0.57–1.00)
Globulin, Total: 2.9 g/dL (ref 1.5–4.5)
Glucose: 86 mg/dL (ref 70–99)
Potassium: 4.2 mmol/L (ref 3.5–5.2)
Sodium: 139 mmol/L (ref 134–144)
Total Protein: 7.1 g/dL (ref 6.0–8.5)
eGFR: 102 mL/min/1.73 (ref >59)

## 2024-05-13 LAB — CBC WITH DIFFERENTIAL/PLATELET
Basophils Absolute: 0.1 x10E3/uL (ref 0.0–0.2)
Basos: 1 %
EOS (ABSOLUTE): 0.2 x10E3/uL (ref 0.0–0.4)
Eos: 2 %
Hematocrit: 39 % (ref 34.0–46.6)
Hemoglobin: 12.5 g/dL (ref 11.1–15.9)
Immature Grans (Abs): 0 x10E3/uL (ref 0.0–0.1)
Immature Granulocytes: 0 %
Lymphocytes Absolute: 2.1 x10E3/uL (ref 0.7–3.1)
Lymphs: 18 %
MCH: 28.9 pg (ref 26.6–33.0)
MCHC: 32.1 g/dL (ref 31.5–35.7)
MCV: 90 fL (ref 79–97)
Monocytes Absolute: 0.8 x10E3/uL (ref 0.1–0.9)
Monocytes: 7 %
Neutrophils Absolute: 8.1 x10E3/uL — ABNORMAL HIGH (ref 1.4–7.0)
Neutrophils: 72 %
Platelets: 366 x10E3/uL (ref 150–450)
RBC: 4.33 x10E6/uL (ref 3.77–5.28)
RDW: 13.2 % (ref 11.7–15.4)
WBC: 11.4 x10E3/uL — ABNORMAL HIGH (ref 3.4–10.8)

## 2024-05-13 LAB — HEMOGLOBIN A1C
Est. average glucose Bld gHb Est-mCnc: 123 mg/dL
Hgb A1c MFr Bld: 5.9 % — ABNORMAL HIGH (ref 4.8–5.6)

## 2024-05-13 LAB — LIPID PANEL W/O CHOL/HDL RATIO
Cholesterol, Total: 142 mg/dL (ref 100–199)
HDL: 59 mg/dL (ref >39)
LDL Chol Calc (NIH): 67 mg/dL (ref 0–99)
Triglycerides: 84 mg/dL (ref 0–149)
VLDL Cholesterol Cal: 16 mg/dL (ref 5–40)

## 2024-05-14 LAB — URINE CULTURE

## 2024-05-29 ENCOUNTER — Encounter: Payer: Self-pay | Admitting: Internal Medicine

## 2024-05-29 ENCOUNTER — Ambulatory Visit (INDEPENDENT_AMBULATORY_CARE_PROVIDER_SITE_OTHER): Admitting: Internal Medicine

## 2024-05-29 VITALS — BP 142/84 | HR 74 | Ht 61.0 in | Wt 192.8 lb

## 2024-05-29 DIAGNOSIS — I152 Hypertension secondary to endocrine disorders: Secondary | ICD-10-CM | POA: Diagnosis not present

## 2024-05-29 DIAGNOSIS — M1711 Unilateral primary osteoarthritis, right knee: Secondary | ICD-10-CM

## 2024-05-29 DIAGNOSIS — E782 Mixed hyperlipidemia: Secondary | ICD-10-CM

## 2024-05-29 DIAGNOSIS — D72829 Elevated white blood cell count, unspecified: Secondary | ICD-10-CM

## 2024-05-29 DIAGNOSIS — E1165 Type 2 diabetes mellitus with hyperglycemia: Secondary | ICD-10-CM

## 2024-05-29 DIAGNOSIS — E1169 Type 2 diabetes mellitus with other specified complication: Secondary | ICD-10-CM

## 2024-05-29 LAB — CBC WITH DIFFERENTIAL/PLATELET
Basophils Absolute: 0.1 x10E3/uL (ref 0.0–0.2)
Basos: 1 %
EOS (ABSOLUTE): 0.3 x10E3/uL (ref 0.0–0.4)
Eos: 3 %
Hematocrit: 38.2 % (ref 34.0–46.6)
Hemoglobin: 12.3 g/dL (ref 11.1–15.9)
Immature Grans (Abs): 0 x10E3/uL (ref 0.0–0.1)
Immature Granulocytes: 0 %
Lymphocytes Absolute: 2.7 x10E3/uL (ref 0.7–3.1)
Lymphs: 25 %
MCH: 28.3 pg (ref 26.6–33.0)
MCHC: 32.2 g/dL (ref 31.5–35.7)
MCV: 88 fL (ref 79–97)
Monocytes Absolute: 0.7 x10E3/uL (ref 0.1–0.9)
Monocytes: 6 %
Neutrophils Absolute: 7.1 x10E3/uL — ABNORMAL HIGH (ref 1.4–7.0)
Neutrophils: 65 %
Platelets: 375 x10E3/uL (ref 150–450)
RBC: 4.35 x10E6/uL (ref 3.77–5.28)
RDW: 13.3 % (ref 11.7–15.4)
WBC: 11 x10E3/uL — ABNORMAL HIGH (ref 3.4–10.8)

## 2024-05-29 NOTE — Progress Notes (Signed)
 Established Patient Office Visit  Subjective:  Patient ID: Diana Bonilla, female    DOB: [birth year]  Age: 63 y.o. MRN: [id number]  Chief Complaint  Patient presents with   Follow-up    2 week follow up    Patient comes in for her follow-up today.  She is generally feeling well and has completed antibiotics for her UTI.  She is not having any further complaints.  Her labs were discussed today.  Her white blood cells were elevated, most probably due to her UTI.  Will repeat today to make sure they have returned to normal.  Rest of her labs were essentially unremarkable. Patient is not scheduled for her colonoscopy. Initial blood pressure is high, repeat shows some improvement.    No other concerns at this time.   Past Medical History:  Diagnosis Date   Diabetes mellitus without complication (HCC)    Hypertension     History reviewed. No pertinent surgical history.  Social History   Socioeconomic History   Marital status: Married    Spouse name: Not on file   Number of children: Not on file   Years of education: Not on file   Highest education level: Not on file  Occupational History   Not on file  Tobacco Use   Smoking status: Never   Smokeless tobacco: Never  Vaping Use   Vaping status: Never Used  Substance and Sexual Activity   Alcohol use: Not Currently   Drug use: Never   Sexual activity: Not on file  Other Topics Concern   Not on file  Social History Narrative   Not on file   Social Drivers of Health   Financial Resource Strain: Not on file  Food Insecurity: Not on file  Transportation Needs: Not on file  Physical Activity: Not on file  Stress: Not on file  Social Connections: Not on file  Intimate Partner Violence: Not on file    Family History  Problem Relation Age of Onset   Osteoporosis Mother    Hypertension Father    Breast cancer Neg Hx     No Known Allergies  Outpatient Medications Prior to Visit  Medication Sig   amLODipine   (NORVASC ) 5 MG tablet Take 5 mg by mouth daily.   losartan -hydrochlorothiazide (HYZAAR) 100-25 MG tablet Take 1 tablet by mouth daily.   metFORMIN (GLUCOPHAGE-XR) 500 MG 24 hr tablet TAKE 1 TABLET BY MOUTH TWICE A DAY   sertraline (ZOLOFT) 50 MG tablet TAKE 1 TABLET BY MOUTH DAILY   simvastatin (ZOCOR) 20 MG tablet TAKE 1 TABLET BY MOUTH DAILY   verapamil  (CALAN -SR) 240 MG CR tablet TAKE 1 TABLET BY MOUTH DAILY   Cholecalciferol (VITAMIN D3) 1.25 MG (50000 UT) CAPS TAKE 1 CAPSULE BY MOUTH ONCE WEEKLY. (Patient not taking: Reported on 05/29/2024)   furosemide  (LASIX ) 20 MG tablet Take 1 tablet (20 mg total) by mouth daily. (Patient not taking: Reported on 05/29/2024)   meloxicam  (MOBIC ) 15 MG tablet Take 1 tablet (15 mg total) by mouth daily as needed. (Patient not taking: Reported on 05/29/2024)   No facility-administered medications prior to visit.    Review of Systems  Constitutional: Negative.  Negative for chills, fever and malaise/fatigue.  HENT: Negative.  Negative for congestion and sore throat.   Eyes: Negative.  Negative for blurred vision and pain.  Respiratory: Negative.  Negative for cough and shortness of breath.   Cardiovascular: Negative.  Negative for chest pain, palpitations and leg swelling.  Gastrointestinal: Negative.  Negative  for abdominal pain, blood in stool, constipation, diarrhea, heartburn, melena, nausea and vomiting.  Genitourinary: Negative.  Negative for dysuria, flank pain, frequency and urgency.  Musculoskeletal: Negative.  Negative for joint pain and myalgias.  Skin: Negative.   Neurological: Negative.  Negative for dizziness, tingling, sensory change, weakness and headaches.  Endo/Heme/Allergies: Negative.   Psychiatric/Behavioral: Negative.  Negative for depression and suicidal ideas. The patient is not nervous/anxious.        Objective:   BP (!) 142/84   Pulse 74   Ht 5' 1 (1.549 m)   Wt 192 lb 12.8 oz (87.5 kg)   SpO2 98%   BMI 36.43 kg/m    Vitals:   05/29/24 1511 05/29/24 1522  BP: (!) 150/88 (!) 142/84  Pulse: 74   Height: 5' 1 (1.549 m)   Weight: 192 lb 12.8 oz (87.5 kg)   SpO2: 98%   BMI (Calculated): 36.45     Physical Exam Vitals and nursing note reviewed.  Constitutional:      Appearance: Normal appearance.  HENT:     Head: Normocephalic and atraumatic.     Nose: Nose normal.     Mouth/Throat:     Mouth: Mucous membranes are moist.     Pharynx: Oropharynx is clear.  Eyes:     Conjunctiva/sclera: Conjunctivae normal.     Pupils: Pupils are equal, round, and reactive to light.  Cardiovascular:     Rate and Rhythm: Normal rate and regular rhythm.     Pulses: Normal pulses.     Heart sounds: Normal heart sounds. No murmur heard. Pulmonary:     Effort: Pulmonary effort is normal.     Breath sounds: Normal breath sounds. No wheezing.  Abdominal:     General: Bowel sounds are normal.     Palpations: Abdomen is soft.     Tenderness: There is no abdominal tenderness. There is no right CVA tenderness or left CVA tenderness.  Musculoskeletal:        General: Normal range of motion.     Cervical back: Normal range of motion.     Right lower leg: No edema.     Left lower leg: No edema.  Skin:    General: Skin is warm and dry.  Neurological:     General: No focal deficit present.     Mental Status: She is alert and oriented to person, place, and time.  Psychiatric:        Mood and Affect: Mood normal.        Behavior: Behavior normal.      No results found for any visits on 05/29/24.  Recent Results (from the past 2160 hours)  POCT CBG (Fasting - Glucose)     Status: Abnormal   Collection Time: 05/11/24  3:22 PM  Result Value Ref Range   Glucose Fasting, POC 105 (A) 70 - 99 mg/dL  POCT urinalysis dipstick     Status: Abnormal   Collection Time: 05/11/24  3:25 PM  Result Value Ref Range   Color, UA Yellow    Clarity, UA Clear    Glucose, UA Negative Negative   Bilirubin, UA Negative     Ketones, UA Negative    Spec Grav, UA 1.020 1.010 - 1.025   Blood, UA Positive    pH, UA 6.5 5.0 - 8.0   Protein, UA Positive (A) Negative   Urobilinogen, UA 0.2 0.2 or 1.0 E.U./dL   Nitrite, UA Negative    Leukocytes, UA Trace (A) Negative  Appearance     Odor    Urine Culture     Status: None   Collection Time: 05/12/24  8:34 AM   Specimen: Urine   UR  Result Value Ref Range   Urine Culture, Routine Final report    Organism ID, Bacteria Comment     Comment: Mixed urogenital flora 10,000-25,000 colony forming units per mL   CMP14+EGFR     Status: None   Collection Time: 05/12/24  8:36 AM  Result Value Ref Range   Glucose 86 70 - 99 mg/dL   BUN 12 8 - 27 mg/dL   Creatinine, Ser 9.39 0.57 - 1.00 mg/dL   eGFR 897 >40 fO/fpw/8.26   BUN/Creatinine Ratio 20 12 - 28   Sodium 139 134 - 144 mmol/L   Potassium 4.2 3.5 - 5.2 mmol/L   Chloride 101 96 - 106 mmol/L   CO2 26 20 - 29 mmol/L   Calcium 9.1 8.7 - 10.3 mg/dL   Total Protein 7.1 6.0 - 8.5 g/dL   Albumin 4.2 3.9 - 4.9 g/dL   Globulin, Total 2.9 1.5 - 4.5 g/dL   Bilirubin Total 0.2 0.0 - 1.2 mg/dL   Alkaline Phosphatase 93 49 - 135 IU/L   AST 17 0 - 40 IU/L   ALT 16 0 - 32 IU/L  Lipid Panel w/o Chol/HDL Ratio     Status: None   Collection Time: 05/12/24  8:36 AM  Result Value Ref Range   Cholesterol, Total 142 100 - 199 mg/dL   Triglycerides 84 0 - 149 mg/dL   HDL 59 >60 mg/dL   VLDL Cholesterol Cal 16 5 - 40 mg/dL   LDL Chol Calc (NIH) 67 0 - 99 mg/dL  CBC with Diff     Status: Abnormal   Collection Time: 05/12/24  8:36 AM  Result Value Ref Range   WBC 11.4 (H) 3.4 - 10.8 x10E3/uL   RBC 4.33 3.77 - 5.28 x10E6/uL   Hemoglobin 12.5 11.1 - 15.9 g/dL   Hematocrit 60.9 65.9 - 46.6 %   MCV 90 79 - 97 fL   MCH 28.9 26.6 - 33.0 pg   MCHC 32.1 31.5 - 35.7 g/dL   RDW 86.7 88.2 - 84.5 %   Platelets 366 150 - 450 x10E3/uL   Neutrophils 72 Not Estab. %   Lymphs 18 Not Estab. %   Monocytes 7 Not Estab. %   Eos 2 Not  Estab. %   Basos 1 Not Estab. %   Neutrophils Absolute 8.1 (H) 1.4 - 7.0 x10E3/uL   Lymphocytes Absolute 2.1 0.7 - 3.1 x10E3/uL   Monocytes Absolute 0.8 0.1 - 0.9 x10E3/uL   EOS (ABSOLUTE) 0.2 0.0 - 0.4 x10E3/uL   Basophils Absolute 0.1 0.0 - 0.2 x10E3/uL   Immature Granulocytes 0 Not Estab. %   Immature Grans (Abs) 0.0 0.0 - 0.1 x10E3/uL  Hemoglobin A1c     Status: Abnormal   Collection Time: 05/12/24  8:36 AM  Result Value Ref Range   Hgb A1c MFr Bld 5.9 (H) 4.8 - 5.6 %    Comment:          Prediabetes: 5.7 - 6.4          Diabetes: >6.4          Glycemic control for adults with diabetes: <7.0    Est. average glucose Bld gHb Est-mCnc 123 mg/dL      Assessment & Plan:  Patient to continue medications regularly.  Avoid salt.  Proceed with  colonoscopy.  Repeat CBC today. Problem List Items Addressed This Visit     Osteoarthritis of right knee   Type 2 diabetes mellitus with hyperglycemia, without long-term current use of insulin (HCC)   Hypertension associated with diabetes (HCC) - Primary   Combined hyperlipidemia associated with type 2 diabetes mellitus (HCC)   Other Visit Diagnoses       Leukocytosis, unspecified type       Relevant Orders   CBC with Diff       Return in about 3 months (around 08/29/2024).   Total time spent: 30 minutes. This time includes review of previous notes and results and patient face to face interaction during today's visit.    FERNAND FREDY RAMAN, MD  05/29/2024   This document may have been prepared by [facility] Voice Recognition software and as such may include unintentional dictation errors.

## 2024-05-30 ENCOUNTER — Ambulatory Visit: Payer: Self-pay | Admitting: Internal Medicine

## 2024-05-30 NOTE — Progress Notes (Signed)
 Patient notified

## 2024-06-09 ENCOUNTER — Ambulatory Visit: Admitting: Certified Registered Nurse Anesthetist

## 2024-06-09 ENCOUNTER — Ambulatory Visit
Admission: RE | Admit: 2024-06-09 | Discharge: 2024-06-09 | Disposition: A | Discharge status: Home / Self Care | Attending: Gastroenterology | Admitting: Gastroenterology

## 2024-06-09 ENCOUNTER — Encounter
Admission: RE | Disposition: A | Discharge status: Home / Self Care | Payer: Self-pay | Source: Home / Self Care | Attending: Gastroenterology

## 2024-06-09 ENCOUNTER — Encounter: Payer: Self-pay | Admitting: Gastroenterology

## 2024-06-09 DIAGNOSIS — E119 Type 2 diabetes mellitus without complications: Secondary | ICD-10-CM | POA: Diagnosis not present

## 2024-06-09 DIAGNOSIS — Z1211 Encounter for screening for malignant neoplasm of colon: Secondary | ICD-10-CM | POA: Insufficient documentation

## 2024-06-09 DIAGNOSIS — F419 Anxiety disorder, unspecified: Secondary | ICD-10-CM | POA: Insufficient documentation

## 2024-06-09 DIAGNOSIS — I10 Essential (primary) hypertension: Secondary | ICD-10-CM | POA: Diagnosis not present

## 2024-06-09 DIAGNOSIS — Z7984 Long term (current) use of oral hypoglycemic drugs: Secondary | ICD-10-CM | POA: Insufficient documentation

## 2024-06-09 DIAGNOSIS — D122 Benign neoplasm of ascending colon: Secondary | ICD-10-CM | POA: Diagnosis not present

## 2024-06-09 HISTORY — PX: POLYPECTOMY: SHX149

## 2024-06-09 HISTORY — PX: COLONOSCOPY: SHX5424

## 2024-06-09 LAB — GLUCOSE, CAPILLARY: Glucose-Capillary: 89 mg/dL (ref 70–99)

## 2024-06-09 SURGERY — COLONOSCOPY
Anesthesia: General

## 2024-06-09 MED ORDER — SODIUM CHLORIDE 0.9 % IV SOLN
INTRAVENOUS | Status: DC
  Administered 2024-06-09: 20 mL/h via INTRAVENOUS

## 2024-06-09 MED ORDER — LIDOCAINE HCL (CARDIAC) PF 100 MG/5ML IV SOSY
PREFILLED_SYRINGE | INTRAVENOUS | Status: DC | PRN
Start: 2024-06-09 — End: 2024-06-09
  Administered 2024-06-09: 50 mg via INTRAVENOUS

## 2024-06-09 MED ORDER — PROPOFOL 500 MG/50ML IV EMUL
INTRAVENOUS | Status: DC | PRN
  Administered 2024-06-09: 150 ug/kg/min via INTRAVENOUS
  Administered 2024-06-09: 100 mg via INTRAVENOUS

## 2024-06-09 MED ORDER — DEXMEDETOMIDINE HCL IN NACL 80 MCG/20ML IV SOLN
INTRAVENOUS | Status: DC | PRN
  Administered 2024-06-09: 4 ug via INTRAVENOUS
  Administered 2024-06-09: 8 ug via INTRAVENOUS

## 2024-06-09 NOTE — Transfer of Care (Signed)
 Immediate Anesthesia Transfer of Care Note  Patient: Diana Bonilla  Procedure(s) Performed: COLONOSCOPY POLYPECTOMY, INTESTINE  Patient Location: Endoscopy Unit  Anesthesia Type:General  Level of Consciousness: awake, alert , and oriented  Airway & Oxygen Therapy: Patient Spontanous Breathing  Post-op Assessment: Report given to RN and Post -op Vital signs reviewed and stable  Post vital signs: Reviewed and stable  Last Vitals:  Vitals Value Taken Time  BP 80/45 06/09/24 10:42  Temp    Pulse 52 06/09/24 10:43  Resp 15 06/09/24 10:43  SpO2 99 % 06/09/24 10:43  Vitals shown include unfiled device data.  Last Pain:  Vitals:   06/09/24 0945  TempSrc: Temporal  PainSc: 0-No pain         Complications: No notable events documented.

## 2024-06-09 NOTE — H&P (Signed)
 Ruel Kung , MD [address], [address], [place], [place], [address] Phone: [phone] Fax: [phone]  Primary Care Physician:  Fernand Fredy RAMAN, MD   Pre-Procedure History & Physical: HPI:  Diana Bonilla is a 63 y.o. female is here for an colonoscopy.   Past Medical History:  Diagnosis Date   Diabetes mellitus without complication (HCC)    Hypertension     No past surgical history on file.  Prior to Admission medications   Medication Sig Start Date End Date Taking? Authorizing Provider  amLODipine  (NORVASC ) 5 MG tablet Take 5 mg by mouth daily.    [provider]  Cholecalciferol (VITAMIN D3) 1.25 MG (50000 UT) CAPS TAKE 1 CAPSULE BY MOUTH ONCE WEEKLY. Patient not taking: Reported on 05/29/2024 08/16/23   Fernand Fredy RAMAN, MD  furosemide  (LASIX ) 20 MG tablet Take 1 tablet (20 mg total) by mouth daily. Patient not taking: Reported on 05/29/2024 12/02/23   Fernand Fredy RAMAN, MD  losartan -hydrochlorothiazide (HYZAAR) 100-25 MG tablet Take 1 tablet by mouth daily. 11/04/23 11/03/24  Fernand Fredy RAMAN, MD  meloxicam  (MOBIC ) 15 MG tablet Take 1 tablet (15 mg total) by mouth daily as needed. Patient not taking: Reported on 05/29/2024 11/04/19   Cook, Jayce G, DO  metFORMIN (GLUCOPHAGE-XR) 500 MG 24 hr tablet TAKE 1 TABLET BY MOUTH TWICE A DAY 01/20/24   Fernand Fredy RAMAN, MD  sertraline (ZOLOFT) 50 MG tablet TAKE 1 TABLET BY MOUTH DAILY 04/21/24   Fernand Fredy RAMAN, MD  simvastatin (ZOCOR) 20 MG tablet TAKE 1 TABLET BY MOUTH DAILY 12/22/23   Fernand Fredy RAMAN, MD  verapamil  (CALAN -SR) 240 MG CR tablet TAKE 1 TABLET BY MOUTH DAILY 08/17/23   Fernand Fredy RAMAN, MD    Allergies as of 05/24/2024   (No Known Allergies)    Family History  Problem Relation Age of Onset   Osteoporosis Mother    Hypertension Father    Breast cancer Neg Hx     Social History   Socioeconomic  History   Marital status: Married    Spouse name: Not on file   Number of children: Not on file   Years of education: Not on file   Highest education level: Not on file  Occupational History   Not on file  Tobacco Use   Smoking status: Never   Smokeless tobacco: Never  Vaping Use   Vaping status: Never Used  Substance and Sexual Activity   Alcohol use: Not Currently   Drug use: Never   Sexual activity: Not on file  Other Topics Concern   Not on file  Social History Narrative   Not on file   Social Drivers of Health   Financial Resource Strain: Not on file  Food Insecurity: Not on file  Transportation Needs: Not on file  Physical Activity: Not on file  Stress: Not on file  Social Connections: Not on file  Intimate Partner Violence: Not on file    Review of Systems: See HPI, otherwise negative ROS  Physical Exam: There were no vitals taken for this visit. General:   Alert,  pleasant and cooperative in NAD Head:  Normocephalic and atraumatic. Neck:  Supple; no masses or thyromegaly. Lungs:  Clear throughout to auscultation, normal respiratory effort.    Heart:  +S1, +S2, Regular rate and rhythm, No edema. Abdomen:  Soft, nontender and nondistended. Normal bowel sounds, without guarding, and without rebound.   Neurologic:  Alert and  oriented x4;  grossly normal neurologically.  Impression/Plan: Diana Bonilla is here for an colonoscopy to be performed for Screening colonoscopy average risk   Risks, benefits, limitations, and alternatives regarding  colonoscopy have been reviewed with the patient via in person interpretor.  Questions have been answered.  All parties agreeable.   Ruel Kung, MD  06/09/2024, 9:35 AM

## 2024-06-09 NOTE — Anesthesia Procedure Notes (Signed)
 Date/Time: 06/09/2024 10:16 AM  Performed by: Dominica Krabbe, CRNAPre-anesthesia Checklist: Patient identified, Emergency Drugs available, Suction available, Patient being monitored and Timeout performed Patient Re-evaluated:Patient Re-evaluated prior to induction Oxygen Delivery Method: Nasal cannula Preoxygenation: Pre-oxygenation with 100% oxygen Induction Type: IV induction

## 2024-06-09 NOTE — Anesthesia Postprocedure Evaluation (Signed)
 Anesthesia Post Note  Patient: Diana Bonilla  Procedure(s) Performed: COLONOSCOPY POLYPECTOMY, INTESTINE  Patient location during evaluation: Endoscopy Anesthesia Type: General Level of consciousness: awake and alert Pain management: pain level controlled Vital Signs Assessment: post-procedure vital signs reviewed and stable Respiratory status: spontaneous breathing, nonlabored ventilation, respiratory function stable and patient connected to nasal cannula oxygen Cardiovascular status: blood pressure returned to baseline and stable Postop Assessment: no apparent nausea or vomiting Anesthetic complications: no   No notable events documented.   Last Vitals:  Vitals:   06/09/24 1048 06/09/24 1108  BP: (!) 84/40 (!) 100/52  Pulse: (!) 53 (!) 45  Resp: 14 16  Temp:    SpO2: 99% 99%    Last Pain:  Vitals:   06/09/24 1108  TempSrc:   PainSc: 0-No pain                 Prentice Murphy

## 2024-06-09 NOTE — Anesthesia Preprocedure Evaluation (Signed)
 Anesthesia Evaluation  Patient identified by MRN, date of birth, ID band Patient awake    Reviewed: Allergy & Precautions, H&P , NPO status , Patient's Chart, lab work & pertinent test results, reviewed documented beta blocker date and time   History of Anesthesia Complications Negative for: history of anesthetic complications  Airway Mallampati: II  TM Distance: >3 FB Neck ROM: full    Dental  (+) Teeth Intact, Dental Advidsory Given   Pulmonary neg pulmonary ROS   Pulmonary exam normal breath sounds clear to auscultation       Cardiovascular Exercise Tolerance: Good hypertension, (-) angina (-) Past MI and (-) Cardiac Stents Normal cardiovascular exam(-) dysrhythmias (-) Valvular Problems/Murmurs Rhythm:regular Rate:Normal     Neuro/Psych  PSYCHIATRIC DISORDERS Anxiety     negative neurological ROS     GI/Hepatic negative GI ROS, Neg liver ROS,,,  Endo/Other  diabetes, Type 2, Oral Hypoglycemic Agents    Renal/GU negative Renal ROS  negative genitourinary   Musculoskeletal   Abdominal   Peds  Hematology negative hematology ROS (+)   Anesthesia Other Findings Past Medical History: No date: Diabetes mellitus without complication (HCC) No date: Hypertension   Reproductive/Obstetrics negative OB ROS                              Anesthesia Physical Anesthesia Plan  ASA: 2  Anesthesia Plan: General   Post-op Pain Management:    Induction: Intravenous  PONV Risk Score and Plan: 3 and Propofol  infusion, TIVA and Treatment may vary due to age or medical condition  Airway Management Planned: Natural Airway and Nasal Cannula  Additional Equipment:   Intra-op Plan:   Post-operative Plan:   Informed Consent: I have reviewed the patients History and Physical, chart, labs and discussed the procedure including the risks, benefits and alternatives for the proposed anesthesia with the  patient or authorized representative who has indicated his/her understanding and acceptance.     Dental Advisory Given  Plan Discussed with: Anesthesiologist, CRNA and Surgeon  Anesthesia Plan Comments:         Anesthesia Quick Evaluation

## 2024-06-09 NOTE — Op Note (Signed)
 [facility] Patient Name: Diana Bonilla Procedure Date: 06/09/2024 10:16 AM MRN: [id number] Account #: [id number] Date of Birth: 01/03/1961 Admit Type: Outpatient Age: 63 Room: [facility] ENDO ROOM 4 Gender: Female Note Status: Finalized Instrument Name: Colon Scope [id number] Procedure:             Colonoscopy Indications:           Screening for colorectal malignant neoplasm Providers:             Ruel Kung MD, MD Referring MD:          Fredy CANDIE Bathe, MD (Referring MD) Medicines:             Monitored Anesthesia Care Complications:         No immediate complications. Procedure:             Pre-Anesthesia Assessment:                        - Prior to the procedure, a History and Physical was                         performed, and patient medications, allergies and                         sensitivities were reviewed. The patient's tolerance                         of previous anesthesia was reviewed.                        - The risks and benefits of the procedure and the                         sedation options and risks were discussed with the                         patient. All questions were answered and informed                         consent was obtained.                        - After reviewing the risks and benefits, the patient                         was deemed in satisfactory condition to undergo the                         procedure.                        - ASA Grade Assessment: II - A patient with mild                         systemic disease.                        After obtaining informed consent, the colonoscope was                         passed under direct  vision. Throughout the procedure,                         the patient's blood pressure, pulse, and oxygen                         saturations were monitored continuously. The                         Colonoscope was introduced through the anus and                          advanced to the the cecum, identified by the                         appendiceal orifice. The colonoscopy was performed                         with ease. The patient tolerated the procedure well.                         The quality of the bowel preparation was excellent.                         The ileocecal valve, appendiceal orifice, and rectum                         were photographed. Findings:      The perianal and digital rectal examinations were normal.      A 3 mm polyp was found in the ascending colon. The polyp was sessile.       The polyp was removed with a jumbo cold forceps. Resection and retrieval       were complete.      The exam was otherwise without abnormality on direct and retroflexion       views. Impression:            - One 3 mm polyp in the ascending colon, removed with                         a jumbo cold forceps. Resected and retrieved.                        - The examination was otherwise normal on direct and                         retroflexion views. Recommendation:        - Discharge patient to home (with escort).                        - Advance diet as tolerated.                        - Continue present medications.                        - Await pathology results.                        - Repeat colonoscopy for surveillance based on  pathology results. Procedure Code(s):     --- Professional ---                        [phone], Colonoscopy, flexible; with biopsy, single or                         multiple Diagnosis Code(s):     --- Professional ---                        Z12.11, Encounter for screening for malignant neoplasm                         of colon                        D12.2, Benign neoplasm of ascending colon CPT copyright 2022 [employer]. All rights reserved. The codes documented in this report are preliminary and upon coder review may  be revised to meet current compliance requirements. Ruel Kung, MD Ruel Kung MD, MD 06/09/2024 10:34:55 AM This report has been signed electronically. Number of Addenda: 0 Note Initiated On: 06/09/2024 10:16 AM Scope Withdrawal Time: 0 hours 8 minutes 8 seconds  Total Procedure Duration: 0 hours 11 minutes 38 seconds  Estimated Blood Loss:  Estimated blood loss: none.      [facility]

## 2024-06-12 LAB — SURGICAL PATHOLOGY

## 2024-08-16 ENCOUNTER — Other Ambulatory Visit: Payer: Self-pay | Admitting: Internal Medicine

## 2024-08-29 ENCOUNTER — Ambulatory Visit: Admitting: Internal Medicine
